# Patient Record
Sex: Female | Born: 1950 | Race: White | Hispanic: No | Smoking: Current every day smoker
Health system: Southern US, Community
[De-identification: ages and names within clinical notes are randomized; demographics above are authoritative.]

## PROBLEM LIST (undated history)

## (undated) DIAGNOSIS — Z720 Tobacco use: Secondary | ICD-10-CM

## (undated) DIAGNOSIS — J45909 Unspecified asthma, uncomplicated: Secondary | ICD-10-CM

## (undated) DIAGNOSIS — E039 Hypothyroidism, unspecified: Secondary | ICD-10-CM

## (undated) HISTORY — DX: Hypothyroidism, unspecified: E03.9

## (undated) HISTORY — PX: CHOLECYSTECTOMY: SHX55

## (undated) HISTORY — PX: ABDOMINAL HYSTERECTOMY: SUR658

## (undated) HISTORY — PX: APPENDECTOMY: SHX54

## (undated) HISTORY — PX: CERVICAL SPINE SURGERY: SHX589

## (undated) HISTORY — PX: BACK SURGERY: SHX140

## (undated) HISTORY — DX: Unspecified asthma, uncomplicated: J45.909

## (undated) HISTORY — PX: CARPAL TUNNEL RELEASE: SHX101

## (undated) HISTORY — DX: Tobacco use: Z72.0

---

## 2000-05-01 ENCOUNTER — Encounter: Payer: Self-pay | Admitting: Emergency Medicine

## 2000-05-01 ENCOUNTER — Emergency Department (HOSPITAL_COMMUNITY): Admission: EM | Admit: 2000-05-01 | Discharge: 2000-05-01 | Payer: Self-pay | Admitting: Emergency Medicine

## 2000-07-06 ENCOUNTER — Encounter: Payer: Self-pay | Admitting: Family Medicine

## 2000-07-06 ENCOUNTER — Encounter: Admission: RE | Admit: 2000-07-06 | Discharge: 2000-07-06 | Payer: Self-pay | Admitting: Family Medicine

## 2000-08-21 ENCOUNTER — Ambulatory Visit (HOSPITAL_COMMUNITY): Admission: RE | Admit: 2000-08-21 | Discharge: 2000-08-21 | Payer: Self-pay | Admitting: Family Medicine

## 2000-08-21 ENCOUNTER — Encounter: Payer: Self-pay | Admitting: Family Medicine

## 2001-06-01 ENCOUNTER — Encounter: Payer: Self-pay | Admitting: Family Medicine

## 2001-06-01 ENCOUNTER — Encounter: Admission: RE | Admit: 2001-06-01 | Discharge: 2001-06-01 | Payer: Self-pay | Admitting: Family Medicine

## 2001-06-13 ENCOUNTER — Encounter: Payer: Self-pay | Admitting: Family Medicine

## 2001-06-13 ENCOUNTER — Encounter: Admission: RE | Admit: 2001-06-13 | Discharge: 2001-06-13 | Payer: Self-pay | Admitting: Family Medicine

## 2002-05-07 ENCOUNTER — Emergency Department (HOSPITAL_COMMUNITY): Admission: EM | Admit: 2002-05-07 | Discharge: 2002-05-07 | Payer: Self-pay | Admitting: Emergency Medicine

## 2002-06-26 ENCOUNTER — Encounter: Payer: Self-pay | Admitting: Emergency Medicine

## 2002-06-26 ENCOUNTER — Emergency Department (HOSPITAL_COMMUNITY): Admission: EM | Admit: 2002-06-26 | Discharge: 2002-06-26 | Payer: Self-pay | Admitting: Emergency Medicine

## 2003-01-13 ENCOUNTER — Encounter: Admission: RE | Admit: 2003-01-13 | Discharge: 2003-01-13 | Payer: Self-pay

## 2003-02-07 ENCOUNTER — Ambulatory Visit (HOSPITAL_COMMUNITY): Admission: RE | Admit: 2003-02-07 | Discharge: 2003-02-07 | Payer: Self-pay | Admitting: Urology

## 2003-02-07 ENCOUNTER — Encounter: Payer: Self-pay | Admitting: Urology

## 2003-02-21 ENCOUNTER — Ambulatory Visit (HOSPITAL_BASED_OUTPATIENT_CLINIC_OR_DEPARTMENT_OTHER): Admission: RE | Admit: 2003-02-21 | Discharge: 2003-02-21 | Payer: Self-pay | Admitting: Urology

## 2003-03-07 ENCOUNTER — Ambulatory Visit (HOSPITAL_COMMUNITY): Admission: RE | Admit: 2003-03-07 | Discharge: 2003-03-07 | Payer: Self-pay | Admitting: Urology

## 2003-03-07 ENCOUNTER — Encounter: Admission: RE | Admit: 2003-03-07 | Discharge: 2003-03-07 | Payer: Self-pay | Admitting: Urology

## 2003-03-07 ENCOUNTER — Encounter: Payer: Self-pay | Admitting: Urology

## 2003-03-24 ENCOUNTER — Encounter: Payer: Self-pay | Admitting: Urology

## 2003-03-24 ENCOUNTER — Ambulatory Visit (HOSPITAL_COMMUNITY): Admission: RE | Admit: 2003-03-24 | Discharge: 2003-03-24 | Payer: Self-pay | Admitting: Urology

## 2003-10-07 ENCOUNTER — Ambulatory Visit (HOSPITAL_COMMUNITY): Admission: RE | Admit: 2003-10-07 | Discharge: 2003-10-07 | Payer: Self-pay | Admitting: Urology

## 2003-10-31 ENCOUNTER — Ambulatory Visit (HOSPITAL_BASED_OUTPATIENT_CLINIC_OR_DEPARTMENT_OTHER): Admission: RE | Admit: 2003-10-31 | Discharge: 2003-10-31 | Payer: Self-pay | Admitting: Urology

## 2004-04-06 ENCOUNTER — Ambulatory Visit (HOSPITAL_BASED_OUTPATIENT_CLINIC_OR_DEPARTMENT_OTHER): Admission: RE | Admit: 2004-04-06 | Discharge: 2004-04-06 | Payer: Self-pay | Admitting: Urology

## 2006-03-21 ENCOUNTER — Encounter: Admission: RE | Admit: 2006-03-21 | Discharge: 2006-03-21 | Payer: Self-pay | Admitting: Family Medicine

## 2006-04-24 ENCOUNTER — Ambulatory Visit (HOSPITAL_COMMUNITY): Admission: RE | Admit: 2006-04-24 | Discharge: 2006-04-25 | Payer: Self-pay | Admitting: Orthopaedic Surgery

## 2007-11-23 IMAGING — CR DG CHEST 2V
2 series · 2 of 2 positions shown · non-contrast
Comparison: None.

CLINICAL DATA: Pre-admit for L5-S1 disk protrusion.
 CHEST - 2 VIEW:

[view not recorded (1 of 2)]
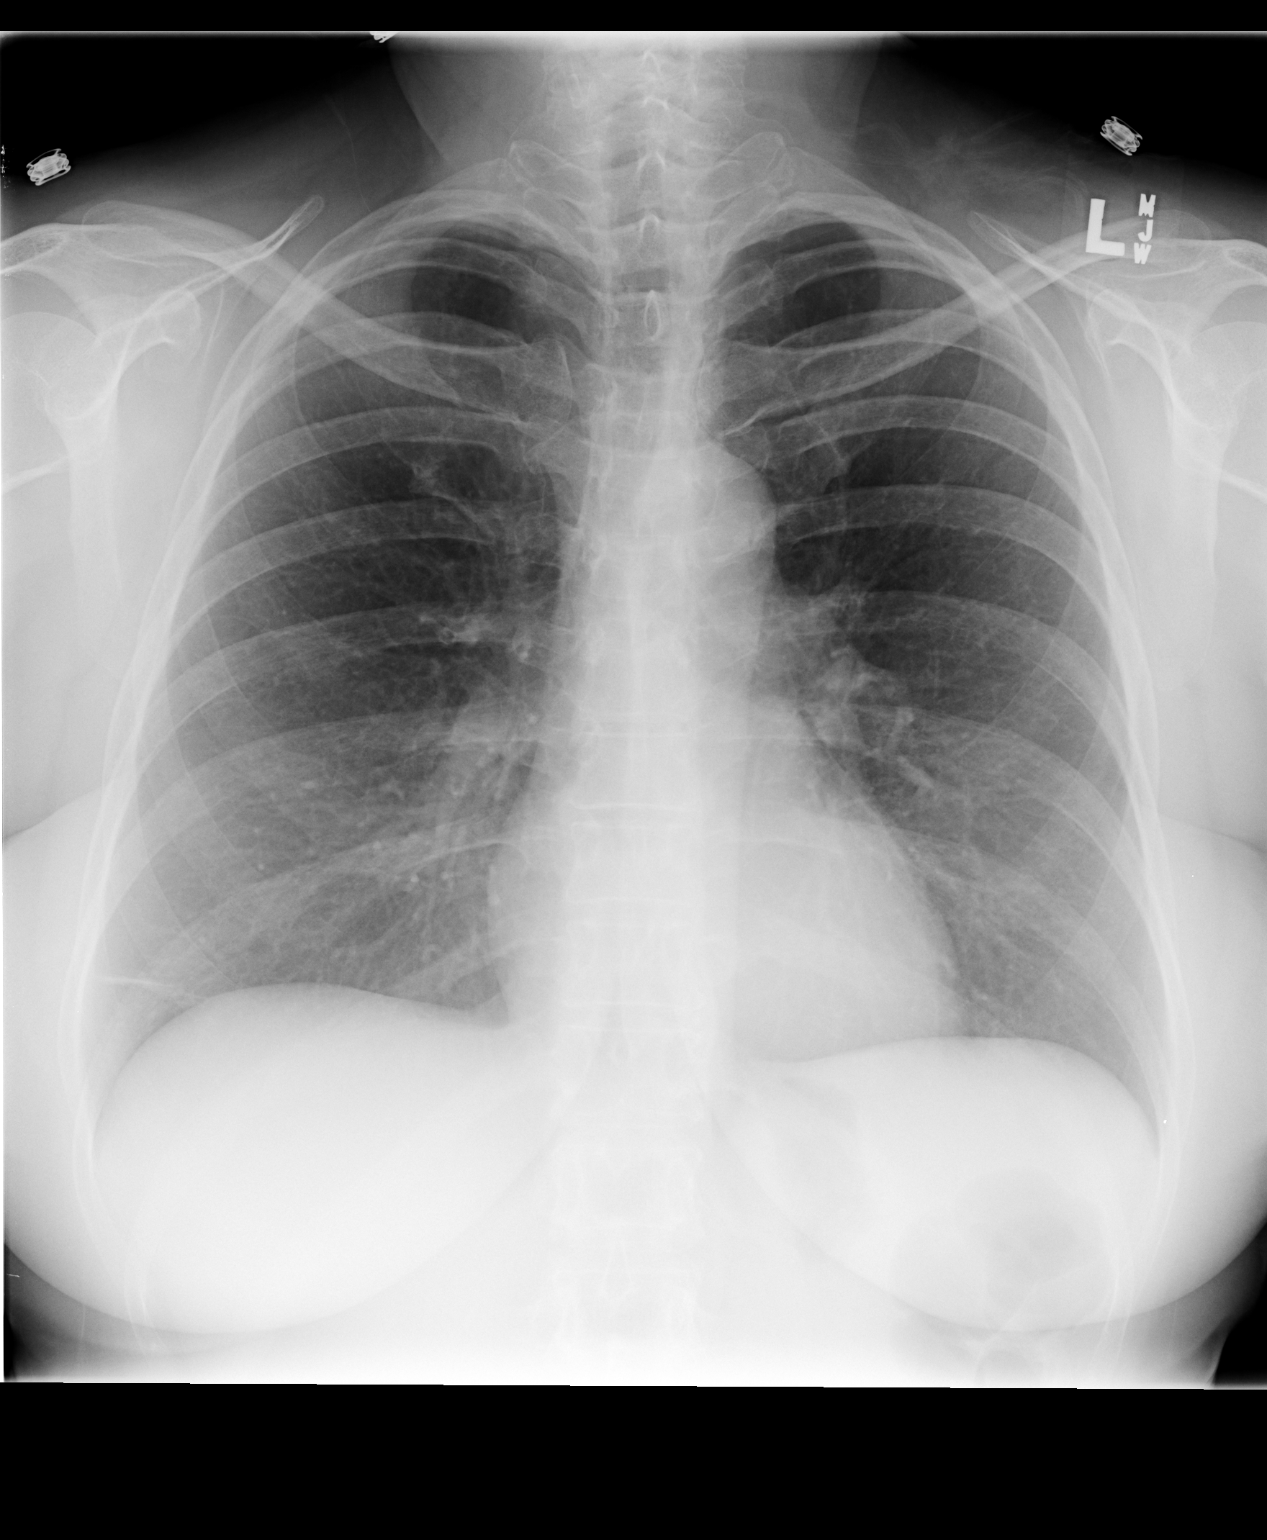

[view not recorded (2 of 2)]
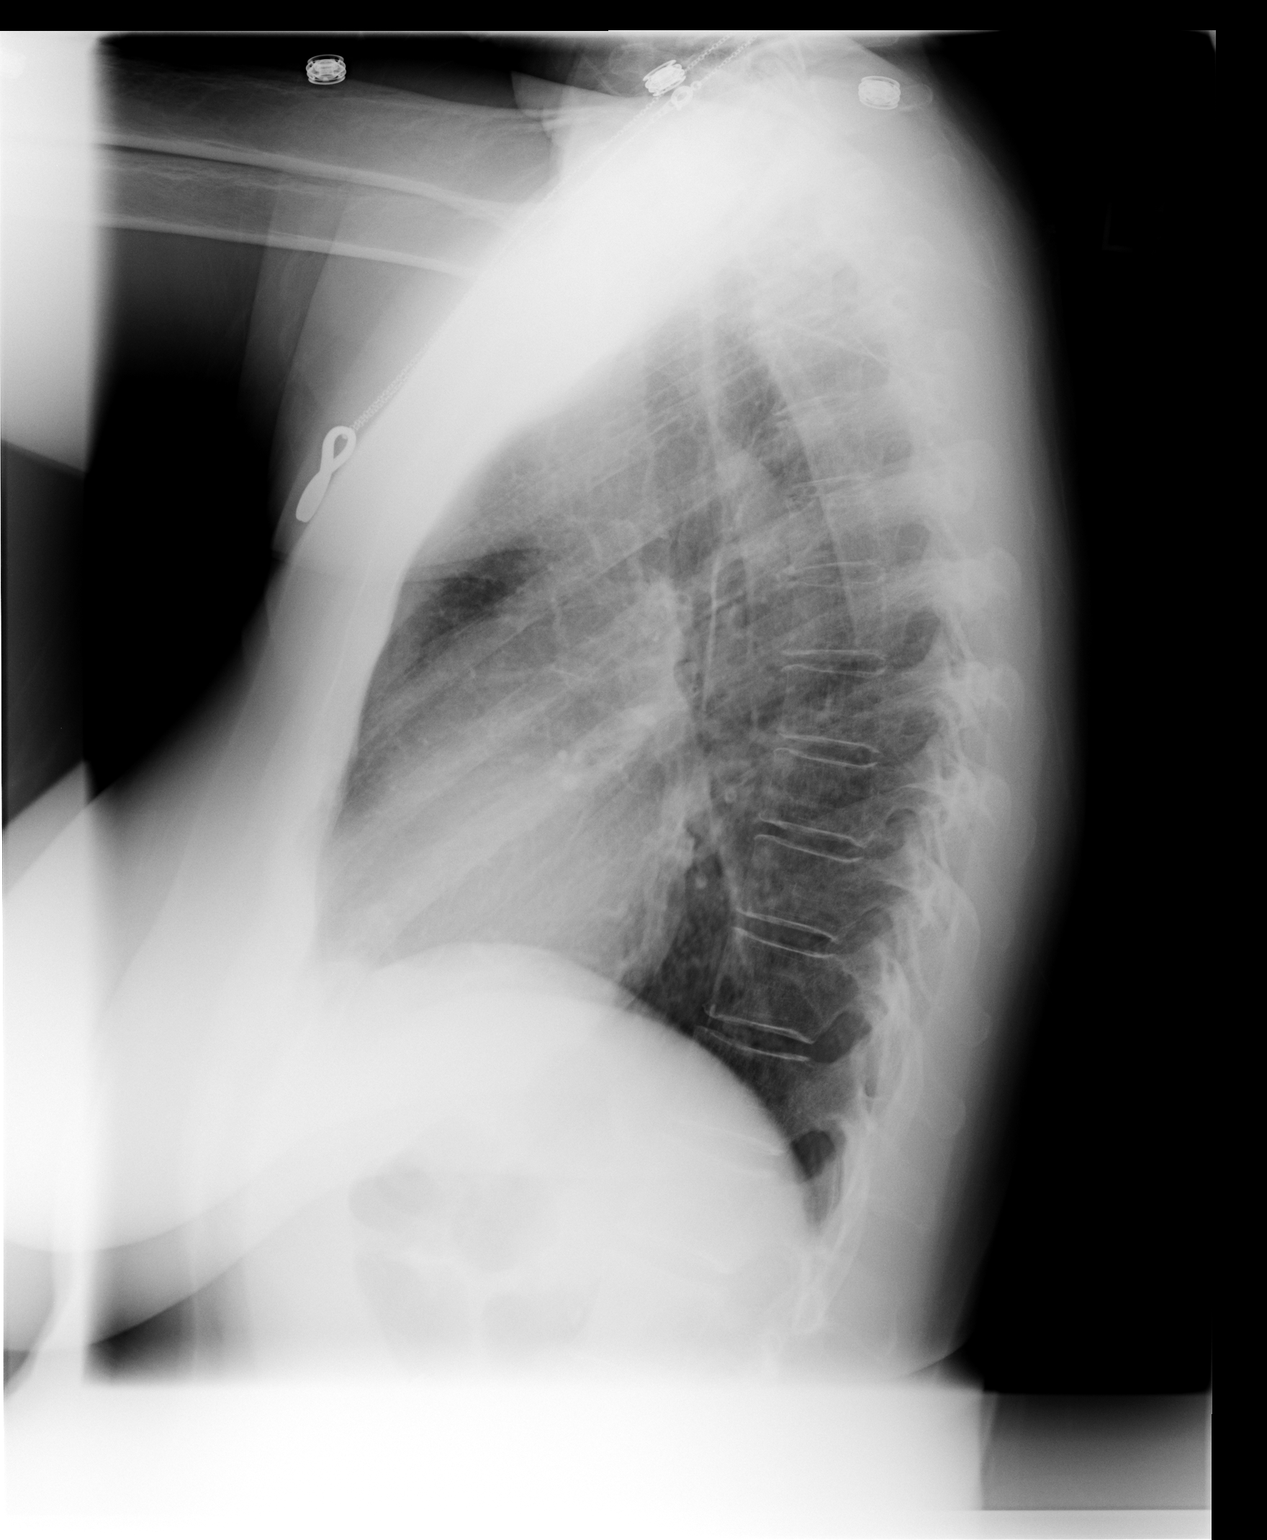

[2 of 2 positions shown; findings below may reference images not displayed]

FINDINGS: The heart and vascularity are normal.  There is mild peribronchial thickening and linear scarring at the right lung base.  No acute process.  Osseous structures are intact.
IMPRESSION: Chronic changes ? no active disease.

## 2010-08-22 ENCOUNTER — Encounter: Payer: Self-pay | Admitting: Urology

## 2013-02-28 ENCOUNTER — Ambulatory Visit (HOSPITAL_BASED_OUTPATIENT_CLINIC_OR_DEPARTMENT_OTHER): Payer: Self-pay

## 2013-03-14 ENCOUNTER — Encounter (HOSPITAL_BASED_OUTPATIENT_CLINIC_OR_DEPARTMENT_OTHER): Payer: Self-pay

## 2015-04-29 ENCOUNTER — Other Ambulatory Visit (HOSPITAL_COMMUNITY): Payer: Self-pay | Admitting: Sports Medicine

## 2015-04-29 DIAGNOSIS — M542 Cervicalgia: Secondary | ICD-10-CM

## 2015-05-11 ENCOUNTER — Ambulatory Visit (HOSPITAL_COMMUNITY)
Admission: RE | Admit: 2015-05-11 | Discharge: 2015-05-11 | Disposition: A | Discharge status: Home / Self Care | Payer: Medicare Other | Source: Ambulatory Visit | Attending: Sports Medicine | Admitting: Sports Medicine

## 2015-05-11 DIAGNOSIS — M4802 Spinal stenosis, cervical region: Secondary | ICD-10-CM | POA: Insufficient documentation

## 2015-05-11 DIAGNOSIS — M50322 Other cervical disc degeneration at C5-C6 level: Secondary | ICD-10-CM | POA: Insufficient documentation

## 2015-05-11 DIAGNOSIS — M542 Cervicalgia: Secondary | ICD-10-CM

## 2016-12-13 IMAGING — MR MR CERVICAL SPINE W/O CM
4 of 5 series · 19 of 48 positions shown · non-contrast
Comparison: None.

CLINICAL DATA: Neck pain with bilateral shoulder pain right greater
than left. Rule out HNP or spinal stenosis

EXAM:
MRI CERVICAL SPINE WITHOUT CONTRAST
TECHNIQUE: Multiplanar, multisequence MR imaging of the cervical spine was
performed. No intravenous contrast was administered.

[Series 5: T2 · sagittal · 3.0mm · 0.35mm/px · 6 of 13 slices shown (1 of 2)]
[im 1/13]
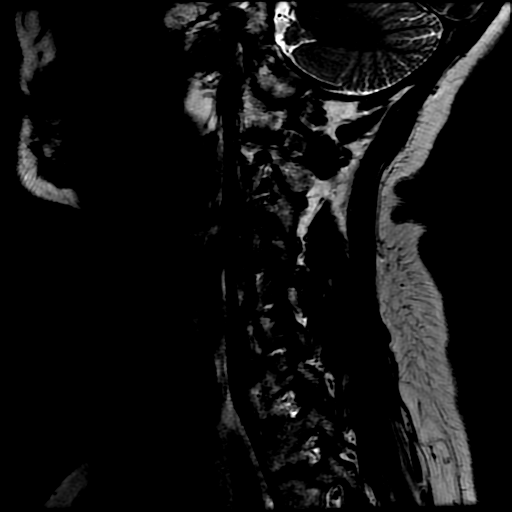
[im 3/13]
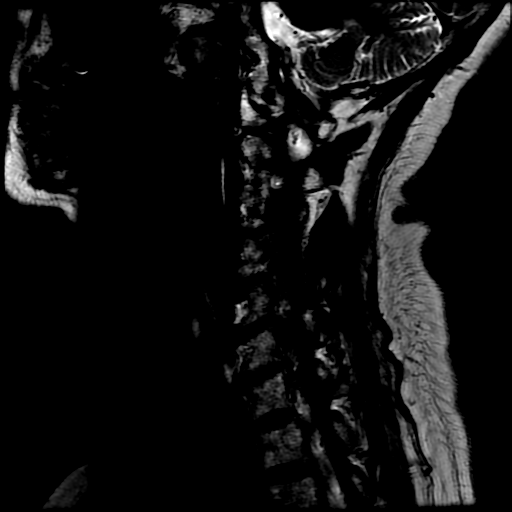
[im 5/13]
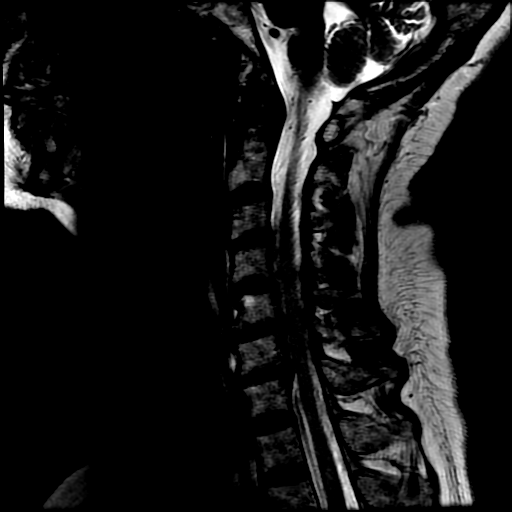
[im 8/13]
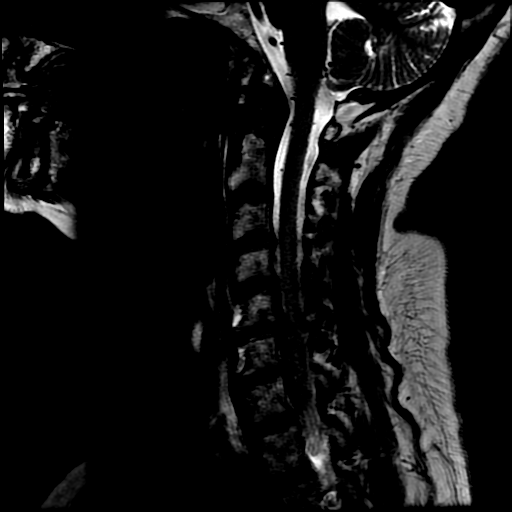
[im 10/13]
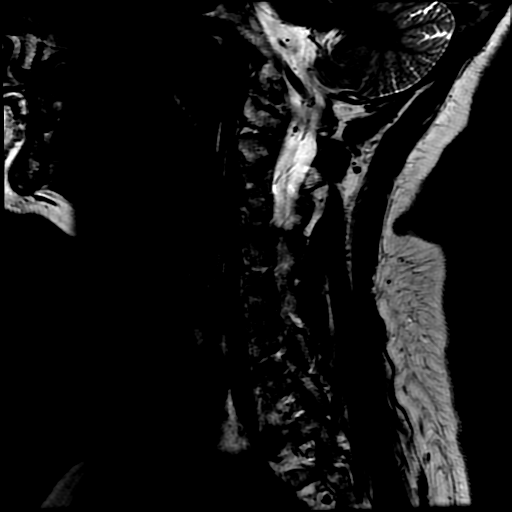
[im 13/13]
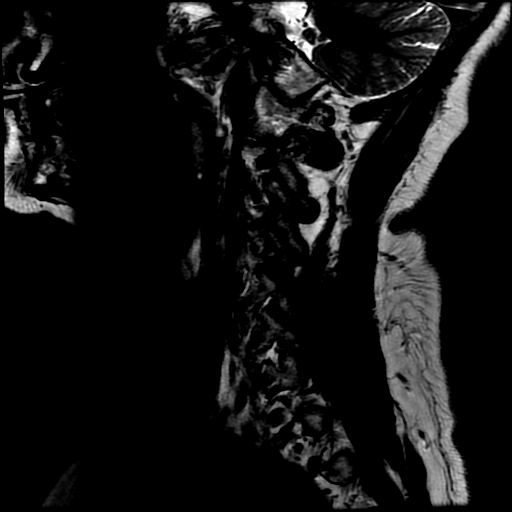

[Series 6: STIR · sagittal · 3.0mm · 0.35mm/px · 3 of 13 slices shown]
[im 3/13]
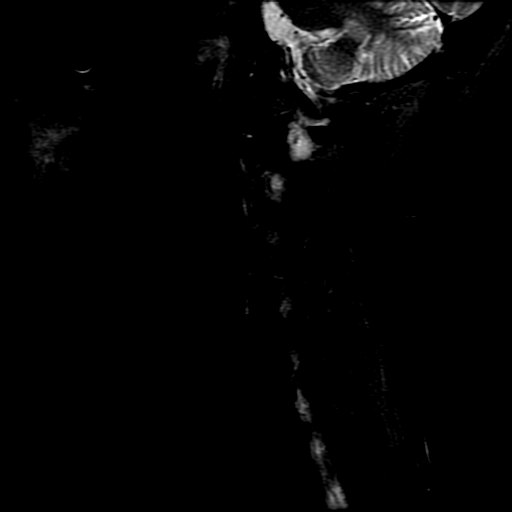
[im 8/13]
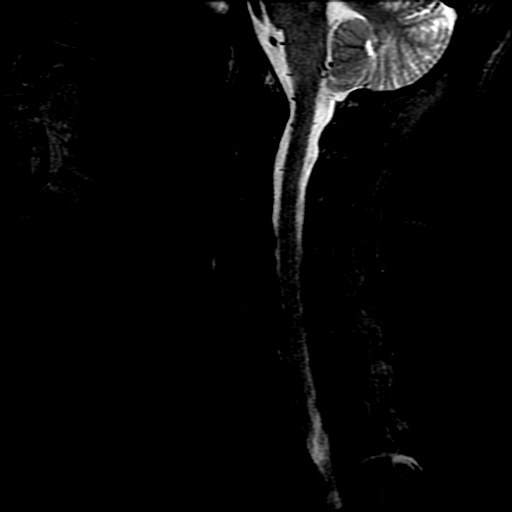
[im 13/13]
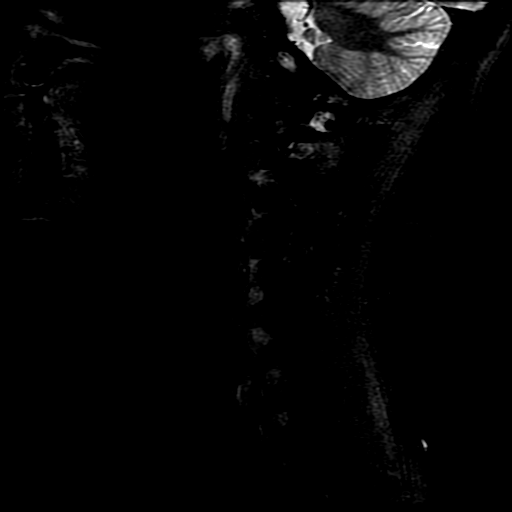

[Series 7: FLAIR · sagittal · 3.0mm · 0.35mm/px · 3 of 13 slices shown]
[im 3/13]
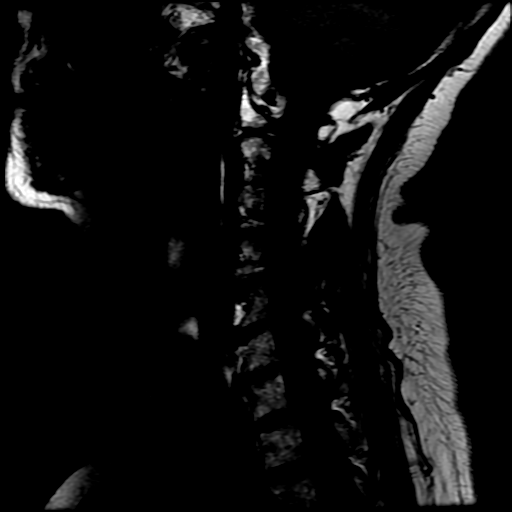
[im 8/13]
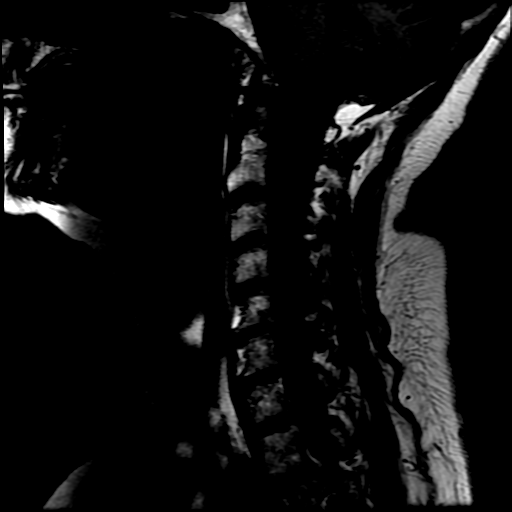
[im 13/13]
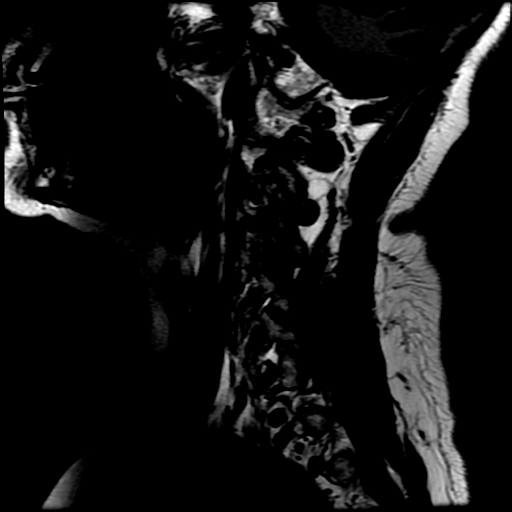

[Series 9: T2 · axial · 3.0mm · 0.35mm/px · z∈[-124,-43]mm · 7 of 31 slices shown (2 of 2)]
[im 1/31]
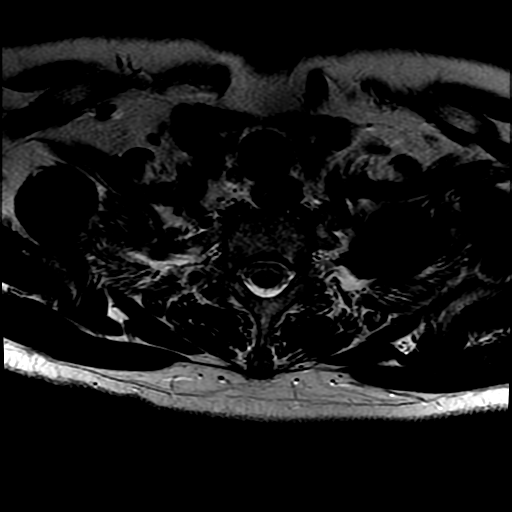
[im 5/31]
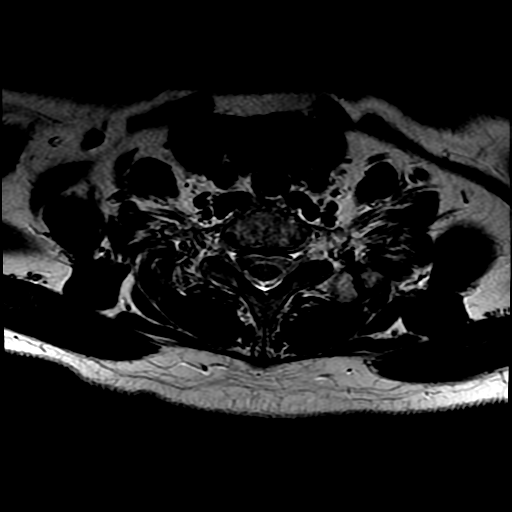
[im 10/31]
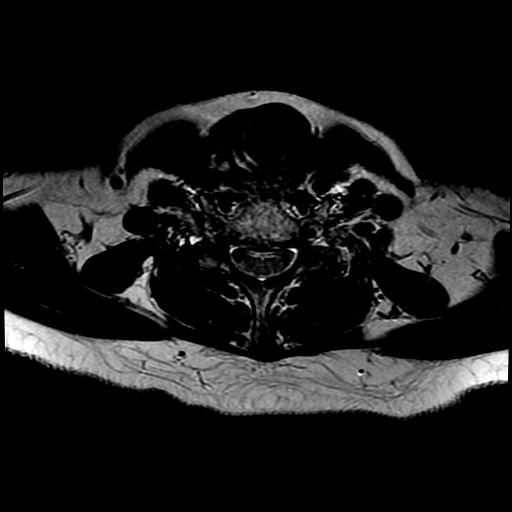
[im 14/31]
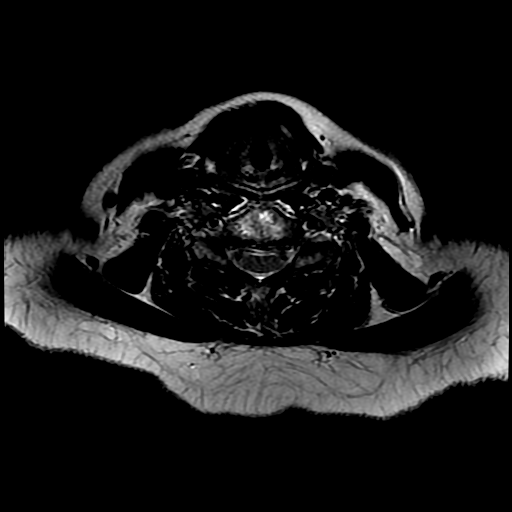
[im 17/31]
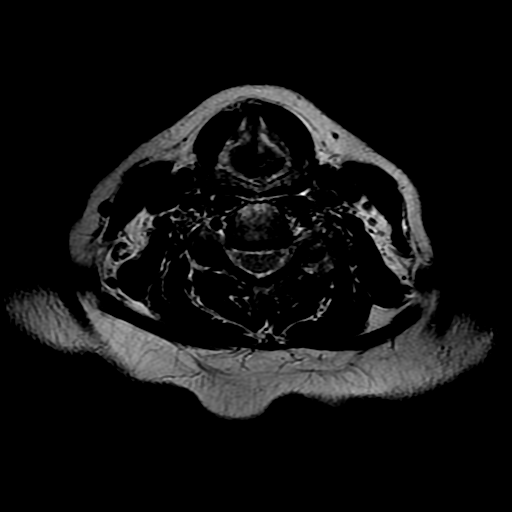
[im 21/31]
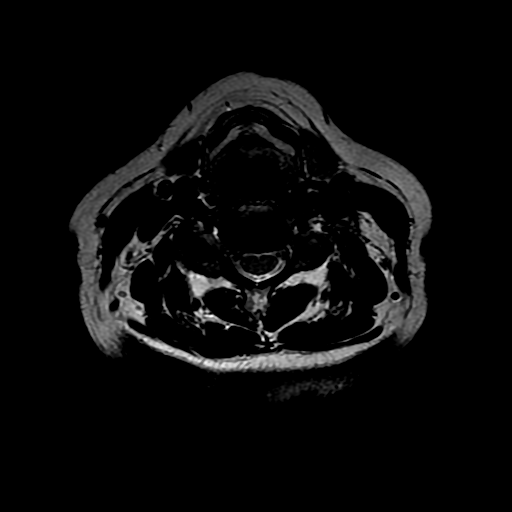
[im 26/31]
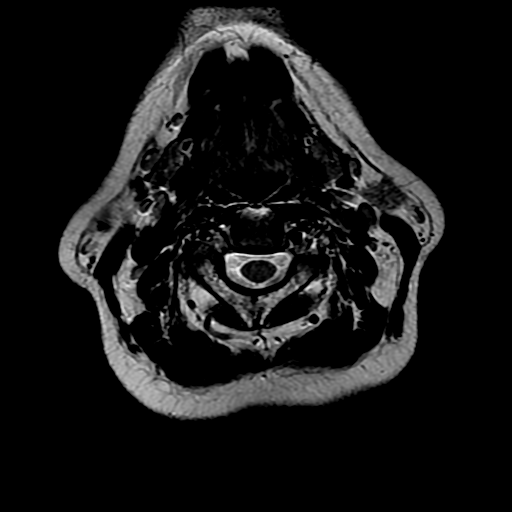

[19 of 48 positions shown; findings below may reference images not displayed]

FINDINGS: Normal cervical alignment. Negative for fracture or mass. No bone
marrow or soft tissue edema. Spinal cord signal normal. Cervical
medullary junction normal.

C2-3:  Negative

C3-4: Mild uncinate spurring right greater than left. Bilateral
facet hypertrophy. Mild spinal stenosis and mild foraminal stenosis
bilaterally.

C4-5: Disc degeneration with diffuse uncinate spurring which is
mild. Facet hypertrophy on the left causing left foraminal
encroachment. Mild spinal stenosis.

C5-6: Disc degeneration and spondylosis with moderate uncinate
spurring diffusely. Moderate spinal stenosis and moderate foraminal
stenosis bilaterally.

C6-7: Disc degeneration and spondylosis with diffuse uncinate
spurring. Moderate left foraminal narrowing due to spurring and mild
right foraminal narrowing. Mild spinal stenosis

C7-T1: Negative
IMPRESSION: Multilevel cervical degenerative change as described above. This is
causing spinal and foraminal stenosis due to uncinate spurring and
facet degeneration. Disc degeneration most prominent at C5-6 and
C6-7.

## 2017-10-31 ENCOUNTER — Ambulatory Visit (INDEPENDENT_AMBULATORY_CARE_PROVIDER_SITE_OTHER): Payer: Self-pay | Admitting: Physical Medicine and Rehabilitation

## 2017-11-14 ENCOUNTER — Ambulatory Visit (INDEPENDENT_AMBULATORY_CARE_PROVIDER_SITE_OTHER): Payer: Self-pay | Admitting: Physical Medicine and Rehabilitation

## 2018-08-06 DIAGNOSIS — Z01818 Encounter for other preprocedural examination: Secondary | ICD-10-CM

## 2019-11-06 ENCOUNTER — Other Ambulatory Visit: Payer: Self-pay

## 2019-11-06 ENCOUNTER — Ambulatory Visit (INDEPENDENT_AMBULATORY_CARE_PROVIDER_SITE_OTHER): Payer: Medicare Other | Admitting: Allergy and Immunology

## 2019-11-06 ENCOUNTER — Encounter: Payer: Self-pay | Admitting: Allergy and Immunology

## 2019-11-06 VITALS — BP 142/74 | HR 80 | Temp 97.2°F | Resp 18 | Ht 63.8 in | Wt 169.0 lb

## 2019-11-06 DIAGNOSIS — J449 Chronic obstructive pulmonary disease, unspecified: Secondary | ICD-10-CM

## 2019-11-06 DIAGNOSIS — J3089 Other allergic rhinitis: Secondary | ICD-10-CM

## 2019-11-06 DIAGNOSIS — F17200 Nicotine dependence, unspecified, uncomplicated: Secondary | ICD-10-CM

## 2019-11-06 MED ORDER — TRELEGY ELLIPTA 200-62.5-25 MCG/INH IN AEPB
1.0000 | INHALATION_SPRAY | Freq: Every day | RESPIRATORY_TRACT | 5 refills | Status: DC
Start: 2019-11-06 — End: 2019-12-04

## 2019-11-06 NOTE — Patient Instructions (Addendum)
  1.  Allergen avoidance measures - mold  2.  Treat and prevent inflammation:   A.  Trelegy 200 -1 inhalation 1 time per day  B.  OTC Nasacort -1 spray each nostril 1 time per day  3.  If needed:   A.  Cetirizine 10 mg -1 tablet 1-2 times a day  B.  Albuterol HFA -2 inhalations every 4-6 hours  4.  Consider using nicotine substitutes to replace tobacco smoke exposure  5.  Obtain Covid vaccine  6.  Consider a course of immunotherapy  7.  Return to clinic in 4 weeks or earlier if problem

## 2019-11-06 NOTE — Progress Notes (Signed)
[facility] - [facility]   NEW PATIENT NOTE  Referring Provider: No ref. provider found Primary Provider: Bonnita Nasuti, MD Date of office visit: 11/06/2019    Subjective:   Chief Complaint:  Isabel George (DOB: Dec 05, 1950) is a 69 y.o. female who presents to the clinic on 11/06/2019 with a chief complaint of Asthma and Allergic Rhinitis  .  HPI: Isabel George presents to this clinic in evaluation of allergies and asthma.  She has a multidecade history of having problems with allergies that show themselves during the spring through fall season with winter season actually doing relatively well manifested as wheezing and coughing and having to use a bronchodilator on a daily basis and some nasal congestion and nose blowing and postnasal drip and throat clearing and a mucous plug stuck in her throat.  As well, she sometimes gets fatigue and just feels kind of swollen all over her body from spring through fall season.  She is interested in undergoing a course of immunotherapy.  Other than the seasonality noted above she does not really note any obvious provoking factor giving rise to this issue except may be a very humid day.  She has tried multiple forms of therapy to address this issue unsuccessfully including the use of antihistamines and short acting bronchodilators.  She currently smokes a half a pack of cigarettes per day which she has been smoking for many decades for stress reduction.  She does consume chocolate on a daily basis but no other forms of caffeine.  She does not have any symptoms to suggest ongoing reflux disease.  She does have a history of gluten intolerance but not a food allergy.  Past Medical History:  Diagnosis Date  . Asthma   . Hypothyroidism   . Tobacco abuse     Past Surgical History:  Procedure Laterality Date  . ABDOMINAL HYSTERECTOMY    . APPENDECTOMY    . BACK SURGERY    . CARPAL TUNNEL RELEASE Right   . CERVICAL SPINE  SURGERY     C5-C7 fusion  . CHOLECYSTECTOMY      Allergies as of 11/06/2019      Reactions   Penicillins Swelling, Other (See Comments)   "drug overdose feeling"   Sulfa Antibiotics Other (See Comments)   Long lasting effect of unresponsiveness      Medication List      cetirizine 10 MG tablet Commonly known as: ZYRTEC Take 10 mg by mouth daily.   D3 VITAMIN PO Take by mouth daily.   Krill Oil 1000 MG Caps Take by mouth daily.   levothyroxine 50 MCG tablet Commonly known as: SYNTHROID Take 50 mcg by mouth daily.   ProAir HFA 108 (90 Base) MCG/ACT inhaler Generic drug: albuterol Inhale 2 puffs into the lungs every 6 (six) hours as needed for wheezing or shortness of breath.   PROBIOTIC DAILY PO Take by mouth daily.       Review of systems negative except as noted in HPI / PMHx or noted below:  Review of Systems  Constitutional: Negative.   HENT: Negative.   Eyes: Negative.   Respiratory: Negative.   Cardiovascular: Negative.   Gastrointestinal: Negative.   Genitourinary: Negative.   Musculoskeletal: Negative.   Skin: Negative.   Neurological: Negative.   Endo/Heme/Allergies: Negative.   Psychiatric/Behavioral: Negative.     Family History  Problem Relation Age of Onset  . Angina Mother   . Throat cancer Mother   .  Heart Problems Maternal Grandmother   . Asthma Grandson   . Heart Problems Son     Social History   Socioeconomic History  . Marital status: Widowed    Spouse name: Not on file  . Number of children: Not on file  . Years of education: Not on file  . Highest education level: Not on file  Occupational History  . Not on file  Tobacco Use  . Smoking status: Current Every Day Smoker    Packs/day: 0.50    Types: Cigarettes  . Smokeless tobacco: Never Used  Substance and Sexual Activity  . Alcohol use: Yes  . Drug use: Never  . Sexual activity: Not on file  Other Topics Concern  . Not on file  Social History Narrative  . Not on  file    Environmental and Social history  Lives in a mobile home with a dry environment, dog located inside the household, carpet in the bedroom, no plastic on the bed, no plastic on the pillow, actively smoking tobacco products.  Objective:   Vitals:   11/06/19 0952  BP: (!) 142/74  Pulse: 80  Resp: 18  Temp: (!) 97.2 F (36.2 C)  SpO2: 97%   Height: 5' 3.8" (162.1 cm) Weight: 169 lb (76.7 kg)  Physical Exam Constitutional:      Appearance: She is not diaphoretic.  HENT:     Head: Normocephalic.     Right Ear: Tympanic membrane, ear canal and external ear normal.     Left Ear: Tympanic membrane, ear canal and external ear normal.     Nose: Nose normal. No mucosal edema or rhinorrhea.     Mouth/Throat:     Pharynx: Uvula midline. No oropharyngeal exudate.  Eyes:     Conjunctiva/sclera: Conjunctivae normal.  Neck:     Thyroid: No thyromegaly.     Trachea: Trachea normal. No tracheal tenderness or tracheal deviation.  Cardiovascular:     Rate and Rhythm: Normal rate and regular rhythm.     Heart sounds: Normal heart sounds, S1 normal and S2 normal. No murmur.  Pulmonary:     Effort: No respiratory distress.     Breath sounds: Normal breath sounds. No stridor. No wheezing or rales.  Lymphadenopathy:     Head:     Right side of head: No tonsillar adenopathy.     Left side of head: No tonsillar adenopathy.     Cervical: No cervical adenopathy.  Skin:    Findings: No erythema or rash.     Nails: There is no clubbing.  Neurological:     Mental Status: She is alert.     Diagnostics: Allergy skin tests were performed.  She demonstrated hypersensitivity to mold.  Spirometry was performed and demonstrated an FEV1 of 1.67 @ 72 % of predicted. FEV1/FVC = 0.62  The patient had an Asthma Control Test with the following results:  .    Results of a chest x-ray obtained 20 October 2017 identified the following:  The heart size and mediastinal contours are within normal  limits. Both lungs are clear. The visualized skeletal structures are Unremarkable.  Results of blood tests obtained 20 October 2017 identified WBC 7.3, absolute eosinophil 200, absolute lymphocyte 3100, hemoglobin 13.0, platelet 214.  Assessment and Plan:    1. COPD with asthma (HCC)   2. Perennial allergic rhinitis   3. Heavy tobacco smoker     1.  Allergen avoidance measures - mold  2.  Treat and prevent inflammation:  A.  Trelegy 200 -1 inhalation 1 time per day  B.  OTC Nasacort -1 spray each nostril 1 time per day  3.  If needed:   A.  Cetirizine 10 mg -1 tablet 1-2 times a day  B.  Albuterol HFA -2 inhalations every 4-6 hours  4.  Consider using nicotine substitutes to replace tobacco smoke exposure  5.  Obtain Covid vaccine  6.  Consider a course of immunotherapy  7.  Return to clinic in 4 weeks or earlier if problem  Cella appears to have an irritated respiratory tract most likely secondary to a combination of her atopic disease and tobacco smoke exposure for which we will have her utilize the plan of action noted above which does include hopefully elimination of tobacco smoke exposure by using a nicotine substitute to replace her cigarettes.  She would definitely be a candidate for immunotherapy and she is presently considering that option.  I will see her back in this clinic in 4 weeks or earlier if there is a problem.  Laurette Schimke, MD Allergy / Immunology [facility]

## 2019-11-07 ENCOUNTER — Encounter: Payer: Self-pay | Admitting: Allergy and Immunology

## 2019-11-20 ENCOUNTER — Other Ambulatory Visit: Payer: Self-pay | Admitting: Allergy and Immunology

## 2019-11-20 DIAGNOSIS — J3089 Other allergic rhinitis: Secondary | ICD-10-CM

## 2019-11-20 DIAGNOSIS — J302 Other seasonal allergic rhinitis: Secondary | ICD-10-CM | POA: Diagnosis not present

## 2019-11-20 NOTE — Progress Notes (Signed)
VIALS EXP 11-19-20 

## 2019-12-04 ENCOUNTER — Encounter: Payer: Self-pay | Admitting: Allergy and Immunology

## 2019-12-04 ENCOUNTER — Ambulatory Visit (INDEPENDENT_AMBULATORY_CARE_PROVIDER_SITE_OTHER): Payer: Medicare Other | Admitting: Allergy and Immunology

## 2019-12-04 ENCOUNTER — Other Ambulatory Visit: Payer: Self-pay

## 2019-12-04 VITALS — BP 152/82 | HR 84 | Temp 97.9°F | Resp 14

## 2019-12-04 DIAGNOSIS — J449 Chronic obstructive pulmonary disease, unspecified: Secondary | ICD-10-CM

## 2019-12-04 DIAGNOSIS — J3089 Other allergic rhinitis: Secondary | ICD-10-CM | POA: Diagnosis not present

## 2019-12-04 DIAGNOSIS — F17200 Nicotine dependence, unspecified, uncomplicated: Secondary | ICD-10-CM

## 2019-12-04 MED ORDER — METHYLPREDNISOLONE ACETATE 80 MG/ML IJ SUSP
80.0000 mg | Freq: Once | INTRAMUSCULAR | Status: AC
  Administered 2019-12-04: 80 mg via INTRAMUSCULAR

## 2019-12-04 MED ORDER — TRELEGY ELLIPTA 200-62.5-25 MCG/INH IN AEPB: 1.0000 | INHALATION_SPRAY | Freq: Every day | RESPIRATORY_TRACT | 1 refills | Status: DC

## 2019-12-04 MED ORDER — OLOPATADINE HCL 0.2 % OP SOLN: OPHTHALMIC | 5 refills | Status: DC

## 2019-12-04 NOTE — Progress Notes (Signed)
[facility] [facility] [place] [facility] [place]   Follow-up Note  Referring Provider: Bonnita Nasuti, MD Primary Provider: Bonnita Nasuti, MD Date of Office Visit: 12/04/2019  Subjective:   Isabel George (DOB: November 22, 1950) is a 69 y.o. female who returns to the [facility] [place] on 12/04/2019 in re-evaluation of the following:  HPI: Evita returns to this clinic in evaluation of allergic rhinitis and asthma and tobacco smoking addressed during her initial evaluation of 06 November 2019.  She really did quite well until the the hot weather arrived over the course of the past week or so.  Prior to that point in time she really did feel as though Trelegy resulted in good improvement regarding her coughing and other respiratory tract symptoms.  Currently she needs to use her bronchodilator a few times per day and has coughing and wheezing has had a little bit of problem with her upper airways although no anosmia or ugly nasal discharge or symptoms suggesting that there is a ongoing infection.  She continues to smoke a half a pack of cigarettes per day.  She refuses to receive the Covid vaccination.  Allergies as of 12/04/2019      Reactions   Penicillins Swelling, Other (See Comments)   "drug overdose feeling"   Sulfa Antibiotics Other (See Comments)   Long lasting effect of unresponsiveness      Medication List      cetirizine 10 MG tablet Commonly known as: ZYRTEC Take 10 mg by mouth daily.   D3 VITAMIN PO Take by mouth daily.   Krill Oil 1000 MG Caps Take by mouth daily.   levothyroxine 50 MCG tablet Commonly known as: SYNTHROID Take 50 mcg by mouth daily.   Nasacort Allergy 24HR 55 MCG/ACT Aero nasal inhaler Generic drug: triamcinolone Place 2 sprays into the nose daily.   ProAir HFA 108 (90 Base) MCG/ACT inhaler Generic drug: albuterol Inhale 2 puffs into the lungs every 6 (six) hours as needed for wheezing or shortness of breath.   PROBIOTIC DAILY  PO Take by mouth daily.   Trelegy Ellipta 200-62.5-25 MCG/INH Aepb Generic drug: Fluticasone-Umeclidin-Vilant Inhale 1 Dose into the lungs daily. Rinse, gargle, and spit after use.       Past Medical History:  Diagnosis Date  . Asthma   . Hypothyroidism   . Tobacco abuse     Past Surgical History:  Procedure Laterality Date  . ABDOMINAL HYSTERECTOMY    . APPENDECTOMY    . BACK SURGERY    . CARPAL TUNNEL RELEASE Right   . CERVICAL SPINE SURGERY     C5-C7 fusion  . CHOLECYSTECTOMY      Review of systems negative except as noted in HPI / PMHx or noted below:  Review of Systems  Constitutional: Negative.   HENT: Negative.   Eyes: Negative.   Respiratory: Negative.   Cardiovascular: Negative.   Gastrointestinal: Negative.   Genitourinary: Negative.   Musculoskeletal: Negative.   Skin: Negative.   Neurological: Negative.   Endo/Heme/Allergies: Negative.   Psychiatric/Behavioral: Negative.      Objective:   Vitals:   12/04/19 1014  BP: (!) 152/82  Pulse: 84  Resp: 14  Temp: 97.9 F (36.6 C)  SpO2: 96%          Physical Exam Constitutional:      Appearance: She is not diaphoretic.  HENT:     Head: Normocephalic.     Right Ear: Tympanic membrane, ear canal and external  ear normal.     Left Ear: Tympanic membrane, ear canal and external ear normal.     Nose: Nose normal. No mucosal edema or rhinorrhea.     Mouth/Throat:     Pharynx: Uvula midline. No oropharyngeal exudate.  Eyes:     Conjunctiva/sclera: Conjunctivae normal.  Neck:     Thyroid: No thyromegaly.     Trachea: Trachea normal. No tracheal tenderness or tracheal deviation.  Cardiovascular:     Rate and Rhythm: Normal rate and regular rhythm.     Heart sounds: Normal heart sounds, S1 normal and S2 normal. No murmur.  Pulmonary:     Effort: No respiratory distress.     Breath sounds: Normal breath sounds. No stridor. No wheezing (Bilateral expiratory wheezes all lung fields) or rales.   Lymphadenopathy:     Head:     Right side of head: No tonsillar adenopathy.     Left side of head: No tonsillar adenopathy.     Cervical: No cervical adenopathy.  Skin:    Findings: No erythema or rash.     Nails: There is no clubbing.  Neurological:     Mental Status: She is alert.     Diagnostics:    Spirometry was performed and demonstrated an FEV1 of 1.23 at 53 % of predicted.  Assessment and Plan:   1. COPD with asthma (HCC)   2. Perennial allergic rhinitis   3. Heavy tobacco smoker     1.  Continue to perform allergen avoidance measures - mold  2.  Continue to treat and prevent inflammation:   A.  Trelegy 200 -1 inhalation 1 time per day  B.  OTC Nasacort -1 spray each nostril 1 time per day  C.  Depo-Medrol 80 IM delivered in clinic today  3.  If needed:   A.  Cetirizine 10 mg -1 tablet 1-2 times a day  B.  Albuterol HFA -2 inhalations every 4-6 hours  C.  Pataday -1 drop each eye 1 time per day  4.  Consider using nicotine substitutes to replace tobacco smoke exposure  5.  Start immunotherapy  6.  Return to clinic in late Summer or earlier if problem  Thomasene appears to have a slight flare of her respiratory tract disease and we will treat her with a systemic steroid as noted above while she continues on a collection of anti-inflammatory agents for airway and I have encouraged her to start her immunotherapy.  As well she obviously needs to stop smoking.  I will see her back in this clinic in the late summer 2021 or earlier if there is a problem.  Laurette Schimke, MD Allergy / Immunology [facility]

## 2019-12-04 NOTE — Patient Instructions (Addendum)
  1.  Continue to perform allergen avoidance measures - mold  2.  Continue to treat and prevent inflammation:   A.  Trelegy 200 -1 inhalation 1 time per day  B.  OTC Nasacort -1 spray each nostril 1 time per day  C.  Depo-Medrol 80 IM delivered in clinic today  3.  If needed:   A.  Cetirizine 10 mg -1 tablet 1-2 times a day  B.  Albuterol HFA -2 inhalations every 4-6 hours  C.  Pataday -1 drop each eye 1 time per day  4.  Consider using nicotine substitutes to replace tobacco smoke exposure  5.  Start immunotherapy  6.  Return to clinic in late Summer or earlier if problem

## 2019-12-05 ENCOUNTER — Encounter: Payer: Self-pay | Admitting: Allergy and Immunology

## 2019-12-09 ENCOUNTER — Ambulatory Visit (INDEPENDENT_AMBULATORY_CARE_PROVIDER_SITE_OTHER): Payer: Medicare Other | Admitting: *Deleted

## 2019-12-09 ENCOUNTER — Other Ambulatory Visit: Payer: Self-pay

## 2019-12-09 DIAGNOSIS — J309 Allergic rhinitis, unspecified: Secondary | ICD-10-CM | POA: Diagnosis not present

## 2019-12-09 MED ORDER — EPINEPHRINE 0.3 MG/0.3ML IJ SOAJ: INTRAMUSCULAR | 3 refills | Status: DC

## 2019-12-16 ENCOUNTER — Ambulatory Visit (INDEPENDENT_AMBULATORY_CARE_PROVIDER_SITE_OTHER): Payer: Medicare Other | Admitting: *Deleted

## 2019-12-16 DIAGNOSIS — J309 Allergic rhinitis, unspecified: Secondary | ICD-10-CM

## 2019-12-23 ENCOUNTER — Ambulatory Visit (INDEPENDENT_AMBULATORY_CARE_PROVIDER_SITE_OTHER): Payer: Medicare Other | Admitting: *Deleted

## 2019-12-23 DIAGNOSIS — J309 Allergic rhinitis, unspecified: Secondary | ICD-10-CM

## 2020-01-01 ENCOUNTER — Ambulatory Visit (INDEPENDENT_AMBULATORY_CARE_PROVIDER_SITE_OTHER): Payer: Medicare Other | Admitting: *Deleted

## 2020-01-01 DIAGNOSIS — J309 Allergic rhinitis, unspecified: Secondary | ICD-10-CM | POA: Diagnosis not present

## 2020-01-07 ENCOUNTER — Ambulatory Visit (INDEPENDENT_AMBULATORY_CARE_PROVIDER_SITE_OTHER): Payer: Medicare Other | Admitting: *Deleted

## 2020-01-07 DIAGNOSIS — J309 Allergic rhinitis, unspecified: Secondary | ICD-10-CM | POA: Diagnosis not present

## 2020-01-13 ENCOUNTER — Ambulatory Visit (INDEPENDENT_AMBULATORY_CARE_PROVIDER_SITE_OTHER): Payer: Medicare Other

## 2020-01-13 DIAGNOSIS — J309 Allergic rhinitis, unspecified: Secondary | ICD-10-CM | POA: Diagnosis not present

## 2020-01-20 ENCOUNTER — Ambulatory Visit (INDEPENDENT_AMBULATORY_CARE_PROVIDER_SITE_OTHER): Payer: Medicare Other | Admitting: *Deleted

## 2020-01-20 DIAGNOSIS — J309 Allergic rhinitis, unspecified: Secondary | ICD-10-CM

## 2020-01-27 ENCOUNTER — Ambulatory Visit (INDEPENDENT_AMBULATORY_CARE_PROVIDER_SITE_OTHER): Payer: Medicare Other

## 2020-01-27 DIAGNOSIS — J309 Allergic rhinitis, unspecified: Secondary | ICD-10-CM | POA: Diagnosis not present

## 2020-01-29 ENCOUNTER — Telehealth: Payer: Self-pay | Admitting: *Deleted

## 2020-01-29 NOTE — Telephone Encounter (Signed)
Isabel George calls stating that she is concerned that the Trelegy is causing her to have issues with her throat/breathing. She said the first little while of taking it wasn't too bad but now she has random spells of feeling like she's choking or that her airway is somewhat tight. She does say that these sensations have never occurred prior to using the Trelegy. She did stop using the Trelegy about 4 days ago and these episodes have not occurred since she stopped it. Please advise.

## 2020-01-29 NOTE — Telephone Encounter (Signed)
Informed Isabel George that we will try her on Breztri. She will pick up a sample so she can try it out prior to obtaining a prescription.

## 2020-01-29 NOTE — Telephone Encounter (Signed)
Please inform patient that she will probably need some type of controller agent to keep her from developing significant lung problems.  Apparently she cannot use Trelegy anymore.  We can try her on Breztri - 2 inhalations 2 times per day

## 2020-02-05 ENCOUNTER — Ambulatory Visit (INDEPENDENT_AMBULATORY_CARE_PROVIDER_SITE_OTHER): Payer: Medicare Other | Admitting: *Deleted

## 2020-02-05 DIAGNOSIS — J309 Allergic rhinitis, unspecified: Secondary | ICD-10-CM | POA: Diagnosis not present

## 2020-02-10 ENCOUNTER — Other Ambulatory Visit: Payer: Self-pay | Admitting: *Deleted

## 2020-02-10 ENCOUNTER — Telehealth: Payer: Self-pay | Admitting: Allergy and Immunology

## 2020-02-10 ENCOUNTER — Ambulatory Visit (INDEPENDENT_AMBULATORY_CARE_PROVIDER_SITE_OTHER): Payer: Medicare Other | Admitting: *Deleted

## 2020-02-10 DIAGNOSIS — J309 Allergic rhinitis, unspecified: Secondary | ICD-10-CM | POA: Diagnosis not present

## 2020-02-10 MED ORDER — BREZTRI AEROSPHERE 160-9-4.8 MCG/ACT IN AERO: INHALATION_SPRAY | RESPIRATORY_TRACT | 1 refills | Status: DC

## 2020-02-10 NOTE — Telephone Encounter (Signed)
Breztri to [employer] please.

## 2020-02-10 NOTE — Telephone Encounter (Signed)
Rx sent 

## 2020-02-19 ENCOUNTER — Ambulatory Visit (INDEPENDENT_AMBULATORY_CARE_PROVIDER_SITE_OTHER): Payer: Medicare Other

## 2020-02-19 DIAGNOSIS — J309 Allergic rhinitis, unspecified: Secondary | ICD-10-CM | POA: Diagnosis not present

## 2020-02-26 ENCOUNTER — Ambulatory Visit (INDEPENDENT_AMBULATORY_CARE_PROVIDER_SITE_OTHER): Payer: Medicare Other

## 2020-02-26 DIAGNOSIS — J309 Allergic rhinitis, unspecified: Secondary | ICD-10-CM

## 2020-03-03 ENCOUNTER — Ambulatory Visit (INDEPENDENT_AMBULATORY_CARE_PROVIDER_SITE_OTHER): Payer: Medicare Other | Admitting: *Deleted

## 2020-03-03 DIAGNOSIS — J309 Allergic rhinitis, unspecified: Secondary | ICD-10-CM | POA: Diagnosis not present

## 2020-03-09 ENCOUNTER — Ambulatory Visit (INDEPENDENT_AMBULATORY_CARE_PROVIDER_SITE_OTHER): Payer: Medicare Other | Admitting: *Deleted

## 2020-03-09 DIAGNOSIS — J309 Allergic rhinitis, unspecified: Secondary | ICD-10-CM | POA: Diagnosis not present

## 2020-03-16 ENCOUNTER — Ambulatory Visit (INDEPENDENT_AMBULATORY_CARE_PROVIDER_SITE_OTHER): Payer: Medicare Other | Admitting: *Deleted

## 2020-03-16 DIAGNOSIS — J309 Allergic rhinitis, unspecified: Secondary | ICD-10-CM

## 2020-03-24 ENCOUNTER — Ambulatory Visit (INDEPENDENT_AMBULATORY_CARE_PROVIDER_SITE_OTHER): Payer: Medicare Other | Admitting: *Deleted

## 2020-03-24 DIAGNOSIS — J309 Allergic rhinitis, unspecified: Secondary | ICD-10-CM | POA: Diagnosis not present

## 2020-03-30 ENCOUNTER — Ambulatory Visit (INDEPENDENT_AMBULATORY_CARE_PROVIDER_SITE_OTHER): Payer: Medicare Other | Admitting: *Deleted

## 2020-03-30 DIAGNOSIS — J309 Allergic rhinitis, unspecified: Secondary | ICD-10-CM

## 2020-04-07 ENCOUNTER — Ambulatory Visit (INDEPENDENT_AMBULATORY_CARE_PROVIDER_SITE_OTHER): Payer: Medicare Other | Admitting: *Deleted

## 2020-04-07 DIAGNOSIS — J309 Allergic rhinitis, unspecified: Secondary | ICD-10-CM

## 2020-04-15 ENCOUNTER — Ambulatory Visit (INDEPENDENT_AMBULATORY_CARE_PROVIDER_SITE_OTHER): Payer: Medicare Other | Admitting: *Deleted

## 2020-04-15 DIAGNOSIS — J309 Allergic rhinitis, unspecified: Secondary | ICD-10-CM | POA: Diagnosis not present

## 2020-04-20 ENCOUNTER — Ambulatory Visit (INDEPENDENT_AMBULATORY_CARE_PROVIDER_SITE_OTHER): Payer: Medicare Other | Admitting: *Deleted

## 2020-04-20 DIAGNOSIS — J309 Allergic rhinitis, unspecified: Secondary | ICD-10-CM | POA: Diagnosis not present

## 2020-04-23 ENCOUNTER — Other Ambulatory Visit: Payer: Self-pay

## 2020-04-23 ENCOUNTER — Encounter: Payer: Self-pay | Admitting: Allergy and Immunology

## 2020-04-23 ENCOUNTER — Ambulatory Visit (INDEPENDENT_AMBULATORY_CARE_PROVIDER_SITE_OTHER): Payer: Medicare Other | Admitting: Allergy and Immunology

## 2020-04-23 VITALS — BP 120/70 | HR 82 | Resp 18

## 2020-04-23 DIAGNOSIS — G43909 Migraine, unspecified, not intractable, without status migrainosus: Secondary | ICD-10-CM | POA: Diagnosis not present

## 2020-04-23 DIAGNOSIS — J3089 Other allergic rhinitis: Secondary | ICD-10-CM | POA: Diagnosis not present

## 2020-04-23 DIAGNOSIS — F17200 Nicotine dependence, unspecified, uncomplicated: Secondary | ICD-10-CM | POA: Diagnosis not present

## 2020-04-23 DIAGNOSIS — J449 Chronic obstructive pulmonary disease, unspecified: Secondary | ICD-10-CM | POA: Diagnosis not present

## 2020-04-23 NOTE — Progress Notes (Signed)
[place] [facility] [place]   Follow-up Note  Referring Provider: Galvin Proffer, MD Primary Provider: Galvin Proffer, MD Date of Office Visit: 04/23/2020  Subjective:   Isabel George (DOB: 10/21/1950) is a 69 y.o. female who [facility] 04[facility]2020 in re-evaluation of the following:  HPI: Isabel George returns to this clinic in evaluation of allergic rhinitis and asthma and tobacco smoking.  Her last visit to this clinic was 04 Dec 2019.  Overall she has done relatively well with her airway issue while she continues to use a combination inhaler on a consistent basis along with her immunotherapy and also continues to smoke tobacco products at a rate of 10 cigarettes/day.  She occasionally uses a short acting bronchodilator but she can go a week without utilizing this agent.  During hot humid days she can use this short acting bronchodilator up to a few times a day.  She has not required a systemic steroid or an antibiotic for any type of airway issue.  She will not be receiving the flu vaccine or the Covid vaccination.  Currently her immunotherapy is going every week without any adverse effect.  She does inform me that she has been having recurrent headaches which has been a longstanding issue.  There is a predilection for involving her right side especially around her right eye.  She uses multiple over-the-counter medications for this headache.  Allergies as of 04/23/2020      Reactions   Penicillins Swelling, Other (See Comments)   "drug overdose feeling"   Sulfa Antibiotics Other (See Comments)   Long lasting effect of unresponsiveness      Medication List    Breztri Aerosphere 160-9-4.8 MCG/ACT Aero Generic drug: Budeson-Glycopyrrol-Formoterol Inhale two puffs twice daily to prevent cough or wheeze. Rinse mouth after use.   D3 VITAMIN PO Take by mouth daily.   EPINEPHrine 0.3 mg/0.3 mL Soaj injection Commonly known as:  EPI-PEN Use as directed for life threatening allergic reactions only   levothyroxine 50 MCG tablet Commonly known as: SYNTHROID Take 50 mcg by mouth daily.   loratadine 10 MG tablet Commonly known as: CLARITIN Take 10 mg by mouth daily.   Nasacort Allergy 24HR 55 MCG/ACT Aero nasal inhaler Generic drug: triamcinolone Place 2 sprays into the nose daily.   Olopatadine HCl 0.2 % Soln Can use one drop in each eye once daily if needed.   ProAir HFA 108 (90 Base) MCG/ACT inhaler Generic drug: albuterol Inhale 2 puffs into the lungs every 6 (six) hours as needed for wheezing or shortness of breath.   PROBIOTIC DAILY PO Take by mouth daily.       Past Medical History:  Diagnosis Date  . Asthma   . Hypothyroidism   . Tobacco abuse     Past Surgical History:  Procedure Laterality Date  . ABDOMINAL HYSTERECTOMY    . APPENDECTOMY    . BACK SURGERY    . CARPAL TUNNEL RELEASE Right   . CERVICAL SPINE SURGERY     C5-C7 fusion  . CHOLECYSTECTOMY      Review of systems negative except as noted in HPI / PMHx or noted below:  Review of Systems  Constitutional: Negative.   HENT: Negative.   Eyes: Negative.   Respiratory: Negative.   Cardiovascular: Negative.   Gastrointestinal: Negative.   Genitourinary: Negative.   Musculoskeletal: Negative.   Skin: Negative.   Neurological: Negative.   Endo/Heme/Allergies: Negative.  Psychiatric/Behavioral: Negative.      Objective:   Vitals:   04/23/20 1105  BP: 120/70  Pulse: 82  Resp: 18  SpO2: 95%          Physical Exam Constitutional:      Appearance: She is not diaphoretic.  HENT:     Head: Normocephalic.     Right Ear: Tympanic membrane, ear canal and external ear normal.     Left Ear: Tympanic membrane, ear canal and external ear normal.     Nose: Nose normal. No mucosal edema or rhinorrhea.     Mouth/Throat:     Pharynx: Uvula midline. No oropharyngeal exudate.  Eyes:     Conjunctiva/sclera: Conjunctivae  normal.  Neck:     Thyroid: No thyromegaly.     Trachea: Trachea normal. No tracheal tenderness or tracheal deviation.  Cardiovascular:     Rate and Rhythm: Normal rate and regular rhythm.     Heart sounds: Normal heart sounds, S1 normal and S2 normal. No murmur heard.   Pulmonary:     Effort: No respiratory distress.     Breath sounds: Normal breath sounds. No stridor. No wheezing or rales.  Lymphadenopathy:     Head:     Right side of head: No tonsillar adenopathy.     Left side of head: No tonsillar adenopathy.     Cervical: No cervical adenopathy.  Skin:    Findings: No erythema or rash.     Nails: There is no clubbing.  Neurological:     Mental Status: She is alert.     Diagnostics: Spirometry was performed.  Her FEV1 was 1.08 which was 47% of predicted.  Assessment and Plan:   1. COPD with asthma (HCC)   2. Perennial allergic rhinitis   3. Heavy tobacco smoker   4. Migraine syndrome     1.  Continue to perform allergen avoidance measures - mold  2.  Continue to treat and prevent inflammation:   A.  Breztri - 2 inhalations 2 times per day with spacer  B.  OTC Nasacort -1 spray each nostril 1 time per day  C.  Immunotherapy  3.  If needed:   A.  Cetirizine 10 mg -1 tablet 1-2 times a day  B.  Albuterol HFA -2 inhalations every 4-6 hours  C.  Pataday -1 drop each eye 1 time per day  4.  Consider using nicotine substitutes to replace tobacco smoke exposure  5.  Visit with Primary doctor about preventative plan for migraines  6.  Return to clinic in 6 months or earlier if problem  Isabel George will continue to use immunotherapy and a combination of anti-inflammatory agents for her atopic respiratory tract disease and hopefully will also at some point discontinue her tobacco smoking hobby which will dramatically improve her respiratory status.  As well, I have asked her to discuss with her primary care doctor about her migraine headaches as she would probably benefit  from some type of preventative antimigraine medication.  Laurette Schimke, MD Allergy / Immunology [facility]

## 2020-04-23 NOTE — Patient Instructions (Signed)
  1.  Continue to perform allergen avoidance measures - mold  2.  Continue to treat and prevent inflammation:   A.  Breztri - 2 inhalations 2 times per day with spacer  B.  OTC Nasacort -1 spray each nostril 1 time per day  C.  Immunotherapy  3.  If needed:   A.  Cetirizine 10 mg -1 tablet 1-2 times a day  B.  Albuterol HFA -2 inhalations every 4-6 hours  C.  Pataday -1 drop each eye 1 time per day  4.  Consider using nicotine substitutes to replace tobacco smoke exposure  5.  Visit with Primary doctor about preventative plan for migraines  6.  Return to clinic in 6 months or earlier if problem

## 2020-04-27 ENCOUNTER — Encounter: Payer: Self-pay | Admitting: Allergy and Immunology

## 2020-05-07 ENCOUNTER — Ambulatory Visit (INDEPENDENT_AMBULATORY_CARE_PROVIDER_SITE_OTHER): Payer: Medicare Other

## 2020-05-07 DIAGNOSIS — J309 Allergic rhinitis, unspecified: Secondary | ICD-10-CM | POA: Diagnosis not present

## 2020-05-13 ENCOUNTER — Ambulatory Visit (INDEPENDENT_AMBULATORY_CARE_PROVIDER_SITE_OTHER): Payer: Medicare Other | Admitting: *Deleted

## 2020-05-13 DIAGNOSIS — J309 Allergic rhinitis, unspecified: Secondary | ICD-10-CM | POA: Diagnosis not present

## 2020-05-19 ENCOUNTER — Ambulatory Visit (INDEPENDENT_AMBULATORY_CARE_PROVIDER_SITE_OTHER): Payer: Medicare Other | Admitting: *Deleted

## 2020-05-19 DIAGNOSIS — J309 Allergic rhinitis, unspecified: Secondary | ICD-10-CM

## 2020-06-01 ENCOUNTER — Ambulatory Visit (INDEPENDENT_AMBULATORY_CARE_PROVIDER_SITE_OTHER): Payer: Medicare Other | Admitting: *Deleted

## 2020-06-01 DIAGNOSIS — J309 Allergic rhinitis, unspecified: Secondary | ICD-10-CM | POA: Diagnosis not present

## 2020-06-10 ENCOUNTER — Ambulatory Visit (INDEPENDENT_AMBULATORY_CARE_PROVIDER_SITE_OTHER): Payer: Medicare Other

## 2020-06-10 DIAGNOSIS — J309 Allergic rhinitis, unspecified: Secondary | ICD-10-CM

## 2020-06-17 ENCOUNTER — Ambulatory Visit (INDEPENDENT_AMBULATORY_CARE_PROVIDER_SITE_OTHER): Payer: Medicare Other

## 2020-06-17 DIAGNOSIS — J309 Allergic rhinitis, unspecified: Secondary | ICD-10-CM

## 2020-06-23 ENCOUNTER — Ambulatory Visit (INDEPENDENT_AMBULATORY_CARE_PROVIDER_SITE_OTHER): Payer: Medicare Other

## 2020-06-23 DIAGNOSIS — J309 Allergic rhinitis, unspecified: Secondary | ICD-10-CM | POA: Diagnosis not present

## 2020-06-29 DIAGNOSIS — J302 Other seasonal allergic rhinitis: Secondary | ICD-10-CM

## 2020-06-29 NOTE — Progress Notes (Signed)
VIAL EXP 06-29-21 

## 2020-06-30 ENCOUNTER — Ambulatory Visit (INDEPENDENT_AMBULATORY_CARE_PROVIDER_SITE_OTHER): Payer: Medicare Other | Admitting: *Deleted

## 2020-06-30 DIAGNOSIS — J309 Allergic rhinitis, unspecified: Secondary | ICD-10-CM | POA: Diagnosis not present

## 2020-07-06 ENCOUNTER — Ambulatory Visit (INDEPENDENT_AMBULATORY_CARE_PROVIDER_SITE_OTHER): Payer: Medicare Other | Admitting: *Deleted

## 2020-07-06 DIAGNOSIS — J309 Allergic rhinitis, unspecified: Secondary | ICD-10-CM | POA: Diagnosis not present

## 2020-07-15 ENCOUNTER — Ambulatory Visit (INDEPENDENT_AMBULATORY_CARE_PROVIDER_SITE_OTHER): Payer: Medicare Other | Admitting: *Deleted

## 2020-07-15 DIAGNOSIS — J309 Allergic rhinitis, unspecified: Secondary | ICD-10-CM

## 2020-07-22 ENCOUNTER — Other Ambulatory Visit: Payer: Self-pay | Admitting: Allergy and Immunology

## 2020-07-29 ENCOUNTER — Ambulatory Visit (INDEPENDENT_AMBULATORY_CARE_PROVIDER_SITE_OTHER): Payer: Medicare Other | Admitting: *Deleted

## 2020-07-29 DIAGNOSIS — J309 Allergic rhinitis, unspecified: Secondary | ICD-10-CM

## 2020-08-06 ENCOUNTER — Ambulatory Visit (INDEPENDENT_AMBULATORY_CARE_PROVIDER_SITE_OTHER): Payer: Medicare Other

## 2020-08-06 DIAGNOSIS — J309 Allergic rhinitis, unspecified: Secondary | ICD-10-CM | POA: Diagnosis not present

## 2020-08-19 ENCOUNTER — Ambulatory Visit (INDEPENDENT_AMBULATORY_CARE_PROVIDER_SITE_OTHER): Payer: Medicare Other

## 2020-08-19 DIAGNOSIS — J309 Allergic rhinitis, unspecified: Secondary | ICD-10-CM | POA: Diagnosis not present

## 2020-08-25 ENCOUNTER — Ambulatory Visit (INDEPENDENT_AMBULATORY_CARE_PROVIDER_SITE_OTHER): Payer: Medicare Other

## 2020-08-25 DIAGNOSIS — J309 Allergic rhinitis, unspecified: Secondary | ICD-10-CM | POA: Diagnosis not present

## 2020-10-22 ENCOUNTER — Ambulatory Visit (INDEPENDENT_AMBULATORY_CARE_PROVIDER_SITE_OTHER): Payer: Medicare Other | Admitting: *Deleted

## 2020-10-22 DIAGNOSIS — J309 Allergic rhinitis, unspecified: Secondary | ICD-10-CM

## 2020-11-03 DIAGNOSIS — J302 Other seasonal allergic rhinitis: Secondary | ICD-10-CM | POA: Diagnosis not present

## 2020-11-04 ENCOUNTER — Ambulatory Visit (INDEPENDENT_AMBULATORY_CARE_PROVIDER_SITE_OTHER): Payer: Medicare Other

## 2020-11-04 DIAGNOSIS — J309 Allergic rhinitis, unspecified: Secondary | ICD-10-CM

## 2020-11-04 NOTE — Progress Notes (Signed)
VIAL EXP 11-04-21 

## 2020-11-10 ENCOUNTER — Ambulatory Visit (INDEPENDENT_AMBULATORY_CARE_PROVIDER_SITE_OTHER): Payer: Medicare Other

## 2020-11-10 DIAGNOSIS — J309 Allergic rhinitis, unspecified: Secondary | ICD-10-CM | POA: Diagnosis not present

## 2020-11-18 ENCOUNTER — Ambulatory Visit (INDEPENDENT_AMBULATORY_CARE_PROVIDER_SITE_OTHER): Payer: Medicare Other | Admitting: *Deleted

## 2020-11-18 DIAGNOSIS — J309 Allergic rhinitis, unspecified: Secondary | ICD-10-CM | POA: Diagnosis not present

## 2020-11-24 ENCOUNTER — Ambulatory Visit (INDEPENDENT_AMBULATORY_CARE_PROVIDER_SITE_OTHER): Payer: Medicare Other

## 2020-11-24 DIAGNOSIS — J309 Allergic rhinitis, unspecified: Secondary | ICD-10-CM | POA: Diagnosis not present

## 2020-11-30 ENCOUNTER — Ambulatory Visit (INDEPENDENT_AMBULATORY_CARE_PROVIDER_SITE_OTHER): Payer: Medicare Other | Admitting: *Deleted

## 2020-11-30 DIAGNOSIS — J309 Allergic rhinitis, unspecified: Secondary | ICD-10-CM | POA: Diagnosis not present

## 2020-12-09 ENCOUNTER — Ambulatory Visit (INDEPENDENT_AMBULATORY_CARE_PROVIDER_SITE_OTHER): Payer: Medicare Other | Admitting: *Deleted

## 2020-12-09 DIAGNOSIS — J309 Allergic rhinitis, unspecified: Secondary | ICD-10-CM

## 2020-12-15 ENCOUNTER — Other Ambulatory Visit: Payer: Self-pay

## 2020-12-15 ENCOUNTER — Ambulatory Visit (INDEPENDENT_AMBULATORY_CARE_PROVIDER_SITE_OTHER): Payer: Medicare Other | Admitting: Sports Medicine

## 2020-12-15 ENCOUNTER — Other Ambulatory Visit: Payer: Self-pay | Admitting: Sports Medicine

## 2020-12-15 ENCOUNTER — Encounter: Payer: Self-pay | Admitting: Sports Medicine

## 2020-12-15 DIAGNOSIS — I10 Essential (primary) hypertension: Secondary | ICD-10-CM | POA: Insufficient documentation

## 2020-12-15 DIAGNOSIS — I714 Abdominal aortic aneurysm, without rupture, unspecified: Secondary | ICD-10-CM | POA: Insufficient documentation

## 2020-12-15 DIAGNOSIS — M79672 Pain in left foot: Secondary | ICD-10-CM

## 2020-12-15 DIAGNOSIS — E559 Vitamin D deficiency, unspecified: Secondary | ICD-10-CM | POA: Insufficient documentation

## 2020-12-15 DIAGNOSIS — E039 Hypothyroidism, unspecified: Secondary | ICD-10-CM | POA: Insufficient documentation

## 2020-12-15 DIAGNOSIS — Z79899 Other long term (current) drug therapy: Secondary | ICD-10-CM | POA: Insufficient documentation

## 2020-12-15 DIAGNOSIS — J449 Chronic obstructive pulmonary disease, unspecified: Secondary | ICD-10-CM | POA: Insufficient documentation

## 2020-12-15 DIAGNOSIS — J302 Other seasonal allergic rhinitis: Secondary | ICD-10-CM | POA: Insufficient documentation

## 2020-12-15 DIAGNOSIS — R519 Headache, unspecified: Secondary | ICD-10-CM | POA: Insufficient documentation

## 2020-12-15 DIAGNOSIS — M5136 Other intervertebral disc degeneration, lumbar region: Secondary | ICD-10-CM | POA: Insufficient documentation

## 2020-12-15 DIAGNOSIS — D518 Other vitamin B12 deficiency anemias: Secondary | ICD-10-CM | POA: Insufficient documentation

## 2020-12-15 DIAGNOSIS — I700 Atherosclerosis of aorta: Secondary | ICD-10-CM | POA: Insufficient documentation

## 2020-12-15 DIAGNOSIS — F411 Generalized anxiety disorder: Secondary | ICD-10-CM | POA: Insufficient documentation

## 2020-12-15 DIAGNOSIS — M2042 Other hammer toe(s) (acquired), left foot: Secondary | ICD-10-CM

## 2020-12-15 DIAGNOSIS — I6523 Occlusion and stenosis of bilateral carotid arteries: Secondary | ICD-10-CM | POA: Insufficient documentation

## 2020-12-15 DIAGNOSIS — K900 Celiac disease: Secondary | ICD-10-CM | POA: Insufficient documentation

## 2020-12-15 DIAGNOSIS — H6120 Impacted cerumen, unspecified ear: Secondary | ICD-10-CM | POA: Insufficient documentation

## 2020-12-15 DIAGNOSIS — E119 Type 2 diabetes mellitus without complications: Secondary | ICD-10-CM | POA: Insufficient documentation

## 2020-12-15 DIAGNOSIS — M79671 Pain in right foot: Secondary | ICD-10-CM

## 2020-12-15 DIAGNOSIS — L84 Corns and callosities: Secondary | ICD-10-CM

## 2020-12-15 DIAGNOSIS — R601 Generalized edema: Secondary | ICD-10-CM | POA: Insufficient documentation

## 2020-12-15 DIAGNOSIS — E042 Nontoxic multinodular goiter: Secondary | ICD-10-CM | POA: Insufficient documentation

## 2020-12-15 DIAGNOSIS — R0989 Other specified symptoms and signs involving the circulatory and respiratory systems: Secondary | ICD-10-CM | POA: Insufficient documentation

## 2020-12-15 DIAGNOSIS — N200 Calculus of kidney: Secondary | ICD-10-CM | POA: Insufficient documentation

## 2020-12-15 DIAGNOSIS — Z780 Asymptomatic menopausal state: Secondary | ICD-10-CM | POA: Insufficient documentation

## 2020-12-15 DIAGNOSIS — E782 Mixed hyperlipidemia: Secondary | ICD-10-CM | POA: Insufficient documentation

## 2020-12-15 DIAGNOSIS — G894 Chronic pain syndrome: Secondary | ICD-10-CM | POA: Insufficient documentation

## 2020-12-15 DIAGNOSIS — F331 Major depressive disorder, recurrent, moderate: Secondary | ICD-10-CM | POA: Insufficient documentation

## 2020-12-15 NOTE — Progress Notes (Signed)
Subjective: Isabel George is a 70 y.o. female patient who presents to office for evaluation of left fifth toe pain reports that her toe is curling and has a lump injured it several years ago and toe heel this way reports that it rubs when she is in shoes the only shoes that helps is her crocs states that she has pain all over both feet sharp shooting tingling aching reports that she has a significant history of back problems and is wondering if this could be related to sciatica or a rheumatological issue since she has a family history of types of arthritis is and knows that she has arthritis all over.  Patient reports that she has tried stretching and cushions without improvement.  Patient denies any other pedal complaints at this time.  Review of systems noncontributory  Patient Active Problem List   Diagnosis Date Noted  . Abdominal aortic aneurysm without rupture (HCC) 12/15/2020  . Cardiovascular symptoms 12/15/2020  . Celiac disease 12/15/2020  . Chronic obstructive pulmonary disease (HCC) 12/15/2020  . Chronic pain syndrome 12/15/2020  . Degeneration of lumbar intervertebral disc 12/15/2020  . Essential hypertension 12/15/2020  . Generalized anxiety disorder 12/15/2020  . Generalized edema 12/15/2020  . Hardening of the aorta (main artery of the heart) (HCC) 12/15/2020  . Headache 12/15/2020  . Hypothyroidism 12/15/2020  . Impacted cerumen 12/15/2020  . Kidney stone 12/15/2020  . Menopause 12/15/2020  . Mixed hyperlipidemia 12/15/2020  . Moderate recurrent major depression (HCC) 12/15/2020  . Non-toxic multinodular goiter 12/15/2020  . Occlusion and stenosis of bilateral carotid arteries 12/15/2020  . Other long term (current) drug therapy 12/15/2020  . Other vitamin B12 deficiency anemias 12/15/2020  . Pain in left foot 12/15/2020  . Seasonal allergic rhinitis 12/15/2020  . Type 2 diabetes mellitus without complications (HCC) 12/15/2020  . Vitamin D deficiency 12/15/2020     Current Outpatient Medications on File Prior to Visit  Medication Sig Dispense Refill  . levothyroxine (SYNTHROID) 50 MCG tablet Take 50 mcg by mouth daily.    Marland Kitchen PROAIR HFA 108 (90 Base) MCG/ACT inhaler Inhale 2 puffs into the lungs every 6 (six) hours as needed for wheezing or shortness of breath.     No current facility-administered medications on file prior to visit.    Allergies  Allergen Reactions  . Penicillins Swelling and Other (See Comments)    "drug overdose feeling"   . Sulfa Antibiotics Other (See Comments)    Long lasting effect of unresponsiveness   . Ezetimibe Other (See Comments)  . Statins Other (See Comments)   Social History   Socioeconomic History  . Marital status: Widowed    Spouse name: Not on file  . Number of children: Not on file  . Years of education: Not on file  . Highest education level: Not on file  Occupational History  . Not on file  Tobacco Use  . Smoking status: Current Every Day Smoker    Packs/day: 0.50    Types: Cigarettes  . Smokeless tobacco: Never Used  Substance and Sexual Activity  . Alcohol use: Yes  . Drug use: Never  . Sexual activity: Not on file  Other Topics Concern  . Not on file  Social History Narrative  . Not on file   Social Determinants of Health   Financial Resource Strain: Not on file  Food Insecurity: Not on file  Transportation Needs: Not on file  Physical Activity: Not on file  Stress: Not on file  Social Connections:  Not on file    Family History  Problem Relation Age of Onset  . Angina Mother   . Throat cancer Mother   . Heart Problems Maternal Grandmother   . Asthma Grandson   . Heart Problems Son     Past Surgical History:  Procedure Laterality Date  . ABDOMINAL HYSTERECTOMY    . APPENDECTOMY    . BACK SURGERY    . CARPAL TUNNEL RELEASE Right   . CERVICAL SPINE SURGERY     C5-C7 fusion  . CHOLECYSTECTOMY      Objective:  General: Alert and oriented x3 in no acute  distress  Dermatology: Small hyperkeratotic lesion overlying left fifth PIPJ dorsally.  No open lesions bilateral lower extremities, no webspace macerations, no ecchymosis bilateral, all nails x 10 are thick but well manicured.  Vascular: Dorsalis Pedis and Posterior Tibial pedal pulses 2/4, Capillary Fill Time 3 seconds,(+) pedal hair growth bilateral, no edema bilateral lower extremities, Temperature gradient within normal limits.  Neurology: Gross sensation intact via light touch bilateral, subjective sharp shooting pains to feet.  Musculoskeletal: Rigid left fifth hammertoe.  Range of motion within normal limits at subtalar, Midtarsal, and MTPJ joints.  Patient has subjective pain all over to both feet however no direct pain with palpation reports that her feet does ache and hurt.  No pain with calf compression bilateral.  Strength within normal limits in all groups bilateral.   Gait: Unassisted, minimally antalgic wearing crocs  Xrays on CD Left foot: Consistent with digital contracture left fifth hammertoe and significant posterior heel spur at Achilles insertion.  No other fracture or dislocation.  Cager's triangle within normal limits.       Assessment and Plan: Problem List Items Addressed This Visit   None   Visit Diagnoses    Hammertoe of left foot    -  Primary   Corn of toe       Foot pain, bilateral           -Complete examination performed -Xrays reviewed on CD -Discussed treatement options for corn and hammertoe that is rigid in nature at left fifth toe -Patient opt for surgical management. Consent obtained for excision of corn and removal of bone at left fifth toe for hammertoe deformity repair.  Pre and Post op course explained. Risks, benefits, alternatives explained. No guarantees given or implied. Surgical booking slip submitted and provided patient with Surgical packet and info for [facility]. -Advised patient that she will need to be out of work a minimum 2 weeks or max  of 6 weeks depending on if her job allows her to go back early with light duty and wearing her surgical shoe -To dispense surgical shoe at surgical center -Meanwhile continue with good supportive shoes that do not rub toe and cushioning and padding as tolerated -Advised patient for other aches and pains may want to follow-up with primary doctor or rheumatologist patient reports that she is in the process of getting a rheumatologist to check for any other inflammatory sources of pain also since patient has a significant back history I advised patient that some of her pain could be related to her back problems -Patient to return to office after surgery or sooner if condition worsens.  Asencion Islam, DPM

## 2020-12-22 ENCOUNTER — Ambulatory Visit (INDEPENDENT_AMBULATORY_CARE_PROVIDER_SITE_OTHER): Payer: Medicare Other

## 2020-12-22 DIAGNOSIS — J309 Allergic rhinitis, unspecified: Secondary | ICD-10-CM

## 2020-12-30 ENCOUNTER — Ambulatory Visit (INDEPENDENT_AMBULATORY_CARE_PROVIDER_SITE_OTHER): Payer: Medicare Other | Admitting: *Deleted

## 2020-12-30 DIAGNOSIS — J309 Allergic rhinitis, unspecified: Secondary | ICD-10-CM

## 2021-01-08 ENCOUNTER — Other Ambulatory Visit: Payer: Self-pay | Admitting: Sports Medicine

## 2021-01-08 DIAGNOSIS — M2042 Other hammer toe(s) (acquired), left foot: Secondary | ICD-10-CM

## 2021-01-08 DIAGNOSIS — Z9889 Other specified postprocedural states: Secondary | ICD-10-CM

## 2021-01-08 NOTE — Progress Notes (Signed)
Post op meds entered 

## 2021-01-11 ENCOUNTER — Telehealth: Payer: Self-pay

## 2021-01-11 ENCOUNTER — Encounter: Payer: Self-pay | Admitting: Sports Medicine

## 2021-01-11 DIAGNOSIS — L84 Corns and callosities: Secondary | ICD-10-CM

## 2021-01-11 DIAGNOSIS — M2042 Other hammer toe(s) (acquired), left foot: Secondary | ICD-10-CM

## 2021-01-11 MED ORDER — PROMETHAZINE HCL 12.5 MG PO TABS: 12.5000 mg | ORAL_TABLET | Freq: Three times a day (TID) | ORAL | 0 refills | Status: DC | PRN

## 2021-01-11 MED ORDER — DOCUSATE SODIUM 100 MG PO CAPS: 100.0000 mg | ORAL_CAPSULE | Freq: Two times a day (BID) | ORAL | 0 refills | Status: DC

## 2021-01-11 MED ORDER — HYDROCODONE-ACETAMINOPHEN 10-325 MG PO TABS: 1.0000 | ORAL_TABLET | Freq: Four times a day (QID) | ORAL | 0 refills | Status: AC | PRN

## 2021-01-11 NOTE — Telephone Encounter (Signed)
Received a message from [place] with [facility]. She stated Corrie Dandy refused home health care for her surgery with Dr. Marylene Land on 01/11/2021. Cala Bradford just wanted [place] to be aware.

## 2021-01-12 ENCOUNTER — Telehealth: Payer: Self-pay | Admitting: Sports Medicine

## 2021-01-12 NOTE — Telephone Encounter (Signed)
Postoperative check phone call made to patient.  Patient reports that she is doing good.  Denies any current symptoms.  Advised patient to continue with rest elevation and keeping dressing clean dry and intact until her next office visit on next week.  I encourage patient to call office back if there are any postoperative questions or concerns meanwhile.  Patient expressed understanding and thanked me for calling. -Dr. Marylene Land

## 2021-01-19 ENCOUNTER — Encounter: Payer: Self-pay | Admitting: Sports Medicine

## 2021-01-19 ENCOUNTER — Ambulatory Visit (INDEPENDENT_AMBULATORY_CARE_PROVIDER_SITE_OTHER): Payer: Medicare Other | Admitting: Sports Medicine

## 2021-01-19 ENCOUNTER — Other Ambulatory Visit: Payer: Self-pay | Admitting: Sports Medicine

## 2021-01-19 ENCOUNTER — Ambulatory Visit (INDEPENDENT_AMBULATORY_CARE_PROVIDER_SITE_OTHER): Payer: Medicare Other

## 2021-01-19 ENCOUNTER — Other Ambulatory Visit: Payer: Self-pay

## 2021-01-19 VITALS — BP 128/73 | HR 72 | Temp 97.5°F | Resp 16

## 2021-01-19 DIAGNOSIS — M2042 Other hammer toe(s) (acquired), left foot: Secondary | ICD-10-CM

## 2021-01-19 DIAGNOSIS — Z9889 Other specified postprocedural states: Secondary | ICD-10-CM

## 2021-01-19 DIAGNOSIS — J309 Allergic rhinitis, unspecified: Secondary | ICD-10-CM

## 2021-01-19 DIAGNOSIS — L84 Corns and callosities: Secondary | ICD-10-CM

## 2021-01-19 NOTE — Progress Notes (Signed)
Subjective: Isabel George is a 70 y.o. female patient seen today in office for POV #1 (DOS 01-11-21), S/P Left 5th hammertoe repain. Patient denies pain at surgical site, but gets pain at 1st and 2nd toe occasionally, denies calf pain, denies headache, chest pain, shortness of breath, nausea, vomiting, fever, or chills. No other issues noted.   Patient Active Problem List   Diagnosis Date Noted   Abdominal aortic aneurysm without rupture (HCC) 12/15/2020   Cardiovascular symptoms 12/15/2020   Celiac disease 12/15/2020   Chronic obstructive pulmonary disease (HCC) 12/15/2020   Chronic pain syndrome 12/15/2020   Degeneration of lumbar intervertebral disc 12/15/2020   Essential hypertension 12/15/2020   Generalized anxiety disorder 12/15/2020   Generalized edema 12/15/2020   Hardening of the aorta (main artery of the heart) (HCC) 12/15/2020   Headache 12/15/2020   Hypothyroidism 12/15/2020   Impacted cerumen 12/15/2020   Kidney stone 12/15/2020   Menopause 12/15/2020   Mixed hyperlipidemia 12/15/2020   Moderate recurrent major depression (HCC) 12/15/2020   Non-toxic multinodular goiter 12/15/2020   Occlusion and stenosis of bilateral carotid arteries 12/15/2020   Other long term (current) drug therapy 12/15/2020   Other vitamin B12 deficiency anemias 12/15/2020   Pain in left foot 12/15/2020   Seasonal allergic rhinitis 12/15/2020   Type 2 diabetes mellitus without complications (HCC) 12/15/2020   Vitamin D deficiency 12/15/2020    Current Outpatient Medications on File Prior to Visit  Medication Sig Dispense Refill   HYDROcodone-acetaminophen (NORCO) 10-325 MG tablet      docusate sodium (COLACE) 100 MG capsule Take 1 capsule (100 mg total) by mouth 2 (two) times daily. 10 capsule 0   levothyroxine (SYNTHROID) 50 MCG tablet Take 50 mcg by mouth daily.     PROAIR HFA 108 (90 Base) MCG/ACT inhaler Inhale 2 puffs into the lungs every 6 (six) hours as needed for wheezing or shortness of  breath.     promethazine (PHENERGAN) 12.5 MG tablet Take 1 tablet (12.5 mg total) by mouth every 8 (eight) hours as needed for nausea or vomiting. 20 tablet 0   No current facility-administered medications on file prior to visit.    Allergies  Allergen Reactions   Penicillins Swelling and Other (See Comments)    "drug overdose feeling"    Sulfa Antibiotics Other (See Comments)    Long lasting effect of unresponsiveness    Ezetimibe Other (See Comments)   Statins Other (See Comments)    Objective: There were no vitals filed for this visit.  General: No acute distress, AAOx3  Left foot: Sutures intact with no gapping or dehiscence at surgical site, mild swelling to left 5th toe, no erythema, no warmth, no drainage, no signs of infection noted, Capillary fill time <3 seconds in all digits, gross sensation present via light touch to left foot. No current pain or crepitation with range of motion left foot but at night patient sometimes gets pains to 1-2 toes.  No pain with calf compression.   Post Op Xray, Left foot: Arthroplasty 5th toe, Soft tissue swelling within normal limits for post op status.   Assessment and Plan:  Problem List Items Addressed This Visit   None Visit Diagnoses     S/P foot surgery, left    -  Primary   Hammertoe of left foot       Corn of toe            -Patient seen and evaluated -Xrays reviewd  -Applied dry sterile dressing to  surgical site left foot secured with Coban wrap and stockinet  -Advised patient to make sure to keep dressings clean, dry, and intact  -Advised patient to continue with post-op shoe on left foot -Advised patient to limit activity to necessity  -Advised patient to ice and elevate as necessary  -Will plan for suture removal at next office visit. In the meantime, patient to call office if any issues or problems arise.   Asencion Islam, DPM

## 2021-01-26 ENCOUNTER — Ambulatory Visit (INDEPENDENT_AMBULATORY_CARE_PROVIDER_SITE_OTHER): Payer: Medicare Other | Admitting: *Deleted

## 2021-01-26 ENCOUNTER — Encounter: Payer: Self-pay | Admitting: Sports Medicine

## 2021-01-26 ENCOUNTER — Ambulatory Visit (INDEPENDENT_AMBULATORY_CARE_PROVIDER_SITE_OTHER): Payer: Medicare Other | Admitting: Sports Medicine

## 2021-01-26 ENCOUNTER — Other Ambulatory Visit: Payer: Self-pay

## 2021-01-26 DIAGNOSIS — L84 Corns and callosities: Secondary | ICD-10-CM

## 2021-01-26 DIAGNOSIS — J309 Allergic rhinitis, unspecified: Secondary | ICD-10-CM

## 2021-01-26 DIAGNOSIS — M79672 Pain in left foot: Secondary | ICD-10-CM

## 2021-01-26 DIAGNOSIS — Z9889 Other specified postprocedural states: Secondary | ICD-10-CM

## 2021-01-26 DIAGNOSIS — M2042 Other hammer toe(s) (acquired), left foot: Secondary | ICD-10-CM

## 2021-01-26 NOTE — Progress Notes (Signed)
Subjective: Isabel George is a 70 y.o. female patient seen today in office for POV # 2 (DOS 01-11-21), S/P Left 5th hammertoe repain. Patient denies pain at surgical site, reports that it feels good and she is ready to get her sutures out, denies calf pain, denies headache, chest pain, shortness of breath, nausea, vomiting, fever, or chills. No other issues noted.   Patient Active Problem List   Diagnosis Date Noted   Abdominal aortic aneurysm without rupture (HCC) 12/15/2020   Cardiovascular symptoms 12/15/2020   Celiac disease 12/15/2020   Chronic obstructive pulmonary disease (HCC) 12/15/2020   Chronic pain syndrome 12/15/2020   Degeneration of lumbar intervertebral disc 12/15/2020   Essential hypertension 12/15/2020   Generalized anxiety disorder 12/15/2020   Generalized edema 12/15/2020   Hardening of the aorta (main artery of the heart) (HCC) 12/15/2020   Headache 12/15/2020   Hypothyroidism 12/15/2020   Impacted cerumen 12/15/2020   Kidney stone 12/15/2020   Menopause 12/15/2020   Mixed hyperlipidemia 12/15/2020   Moderate recurrent major depression (HCC) 12/15/2020   Non-toxic multinodular goiter 12/15/2020   Occlusion and stenosis of bilateral carotid arteries 12/15/2020   Other long term (current) drug therapy 12/15/2020   Other vitamin B12 deficiency anemias 12/15/2020   Pain in left foot 12/15/2020   Seasonal allergic rhinitis 12/15/2020   Type 2 diabetes mellitus without complications (HCC) 12/15/2020   Vitamin D deficiency 12/15/2020    Current Outpatient Medications on File Prior to Visit  Medication Sig Dispense Refill   docusate sodium (COLACE) 100 MG capsule Take 1 capsule (100 mg total) by mouth 2 (two) times daily. 10 capsule 0   Ginger, Zingiber officinalis, (GINGER ROOT) 550 MG CAPS See admin instructions.     HYDROcodone-acetaminophen (NORCO) 10-325 MG tablet      levothyroxine (SYNTHROID) 50 MCG tablet Take 50 mcg by mouth daily.     PROAIR HFA 108 (90  Base) MCG/ACT inhaler Inhale 2 puffs into the lungs every 6 (six) hours as needed for wheezing or shortness of breath.     promethazine (PHENERGAN) 12.5 MG tablet Take 1 tablet (12.5 mg total) by mouth every 8 (eight) hours as needed for nausea or vomiting. 20 tablet 0   No current facility-administered medications on file prior to visit.    Allergies  Allergen Reactions   Penicillins Swelling and Other (See Comments)    "drug overdose feeling"    Sulfa Antibiotics Other (See Comments)    Long lasting effect of unresponsiveness    Ezetimibe Other (See Comments)   Statins Other (See Comments)    Objective: There were no vitals filed for this visit.  General: No acute distress, AAOx3  Left foot: Sutures intact with no gapping or dehiscence at surgical site, mild swelling and ecchymosis to left 5th toe, no erythema, no warmth, no drainage, no signs of infection noted, Capillary fill time <3 seconds in all digits, gross sensation present via light touch to left foot. No current pain or crepitation with range of motion.  No pain with calf compression.     Assessment and Plan:  Problem List Items Addressed This Visit   None Visit Diagnoses     S/P foot surgery, left    -  Primary   Hammertoe of left foot       Corn of toe       Left foot pain            -Patient seen and evaluated -Sutures removed -Applied dry sterile dressing  to surgical site left foot secured with Coban wrap and stockinet  -Advised patient to make sure to keep dressings clean, dry, and intact until tomorrow; on tomorrow may remove for a shower and then redress with a clean sock -Advised patient to slowly transition to a normal shoe that does not rub toe as tolerated may try her croc -Advised patient to limit activity to necessity  -Advised patient to ice and elevate as necessary  -Will plan for postop check and discuss return to work at next office visit. In the meantime, patient to call office if any issues  or problems arise.   Asencion Islam, DPM

## 2021-02-09 ENCOUNTER — Ambulatory Visit (INDEPENDENT_AMBULATORY_CARE_PROVIDER_SITE_OTHER): Payer: Medicare Other | Admitting: *Deleted

## 2021-02-09 ENCOUNTER — Encounter: Payer: Self-pay | Admitting: Sports Medicine

## 2021-02-09 ENCOUNTER — Other Ambulatory Visit: Payer: Self-pay

## 2021-02-09 ENCOUNTER — Ambulatory Visit (INDEPENDENT_AMBULATORY_CARE_PROVIDER_SITE_OTHER): Payer: Medicare Other | Admitting: Sports Medicine

## 2021-02-09 DIAGNOSIS — J309 Allergic rhinitis, unspecified: Secondary | ICD-10-CM | POA: Diagnosis not present

## 2021-02-09 DIAGNOSIS — M2042 Other hammer toe(s) (acquired), left foot: Secondary | ICD-10-CM

## 2021-02-09 DIAGNOSIS — Z9889 Other specified postprocedural states: Secondary | ICD-10-CM

## 2021-02-09 DIAGNOSIS — L84 Corns and callosities: Secondary | ICD-10-CM

## 2021-02-09 DIAGNOSIS — M79672 Pain in left foot: Secondary | ICD-10-CM

## 2021-02-09 NOTE — Progress Notes (Signed)
Subjective: Isabel George is a 70 y.o. female patient seen today in office for POV # 3 (DOS 01-11-21), S/P Left 5th hammertoe repain. Patient denies pain at surgical site, reports that everything feels good, in a normal shoe and is ready to return to work. No other issues noted.   Patient Active Problem List   Diagnosis Date Noted   Abdominal aortic aneurysm without rupture (HCC) 12/15/2020   Cardiovascular symptoms 12/15/2020   Celiac disease 12/15/2020   Chronic obstructive pulmonary disease (HCC) 12/15/2020   Chronic pain syndrome 12/15/2020   Degeneration of lumbar intervertebral disc 12/15/2020   Essential hypertension 12/15/2020   Generalized anxiety disorder 12/15/2020   Generalized edema 12/15/2020   Hardening of the aorta (main artery of the heart) (HCC) 12/15/2020   Headache 12/15/2020   Hypothyroidism 12/15/2020   Impacted cerumen 12/15/2020   Kidney stone 12/15/2020   Menopause 12/15/2020   Mixed hyperlipidemia 12/15/2020   Moderate recurrent major depression (HCC) 12/15/2020   Non-toxic multinodular goiter 12/15/2020   Occlusion and stenosis of bilateral carotid arteries 12/15/2020   Other long term (current) drug therapy 12/15/2020   Other vitamin B12 deficiency anemias 12/15/2020   Pain in left foot 12/15/2020   Seasonal allergic rhinitis 12/15/2020   Type 2 diabetes mellitus without complications (HCC) 12/15/2020   Vitamin D deficiency 12/15/2020    Current Outpatient Medications on File Prior to Visit  Medication Sig Dispense Refill   docusate sodium (COLACE) 100 MG capsule Take 1 capsule (100 mg total) by mouth 2 (two) times daily. 10 capsule 0   Ginger, Zingiber officinalis, (GINGER ROOT) 550 MG CAPS See admin instructions.     HYDROcodone-acetaminophen (NORCO) 10-325 MG tablet      levothyroxine (SYNTHROID) 50 MCG tablet Take 50 mcg by mouth daily.     PROAIR HFA 108 (90 Base) MCG/ACT inhaler Inhale 2 puffs into the lungs every 6 (six) hours as needed for  wheezing or shortness of breath.     promethazine (PHENERGAN) 12.5 MG tablet Take 1 tablet (12.5 mg total) by mouth every 8 (eight) hours as needed for nausea or vomiting. 20 tablet 0   No current facility-administered medications on file prior to visit.    Allergies  Allergen Reactions   Penicillins Swelling and Other (See Comments)    "drug overdose feeling"    Sulfa Antibiotics Other (See Comments)    Long lasting effect of unresponsiveness    Ezetimibe Other (See Comments)   Statins Other (See Comments)    Objective: There were no vitals filed for this visit.  General: No acute distress, AAOx3  Left foot: Incision well healed, no erythema, no warmth, no drainage, no signs of infection noted, Capillary fill time <3 seconds in all digits, gross sensation present via light touch to left foot. No current pain or crepitation with range of motion.  No pain with calf compression.     Assessment and Plan:  Problem List Items Addressed This Visit   None Visit Diagnoses     S/P foot surgery, left    -  Primary   Hammertoe of left foot       Corn of toe       Left foot pain            -Patient seen and evaluated -Incision well healed -Advised OTC scar creams or vit E -Advised patient to use good supportive shoes daily -May return to work no restrictions  -Return PRN. In the meantime, patient to call office  if any issues or problems arise.   Landis Martins, DPM

## 2021-02-17 DIAGNOSIS — J302 Other seasonal allergic rhinitis: Secondary | ICD-10-CM

## 2021-02-17 NOTE — Progress Notes (Signed)
VIAL EXP 02-17-22

## 2021-02-25 ENCOUNTER — Ambulatory Visit (INDEPENDENT_AMBULATORY_CARE_PROVIDER_SITE_OTHER): Payer: Medicare Other | Admitting: *Deleted

## 2021-02-25 DIAGNOSIS — J309 Allergic rhinitis, unspecified: Secondary | ICD-10-CM

## 2021-03-09 ENCOUNTER — Ambulatory Visit (INDEPENDENT_AMBULATORY_CARE_PROVIDER_SITE_OTHER): Payer: Medicare Other | Admitting: *Deleted

## 2021-03-09 DIAGNOSIS — J309 Allergic rhinitis, unspecified: Secondary | ICD-10-CM | POA: Diagnosis not present

## 2021-03-31 ENCOUNTER — Ambulatory Visit (INDEPENDENT_AMBULATORY_CARE_PROVIDER_SITE_OTHER): Payer: Medicare Other | Admitting: *Deleted

## 2021-03-31 DIAGNOSIS — J309 Allergic rhinitis, unspecified: Secondary | ICD-10-CM | POA: Diagnosis not present

## 2021-04-14 ENCOUNTER — Ambulatory Visit (INDEPENDENT_AMBULATORY_CARE_PROVIDER_SITE_OTHER): Payer: Medicare Other | Admitting: *Deleted

## 2021-04-14 DIAGNOSIS — J309 Allergic rhinitis, unspecified: Secondary | ICD-10-CM

## 2021-04-29 ENCOUNTER — Ambulatory Visit (INDEPENDENT_AMBULATORY_CARE_PROVIDER_SITE_OTHER): Payer: Medicare Other | Admitting: Allergy and Immunology

## 2021-04-29 ENCOUNTER — Encounter: Payer: Self-pay | Admitting: Allergy and Immunology

## 2021-04-29 ENCOUNTER — Other Ambulatory Visit: Payer: Self-pay

## 2021-04-29 VITALS — BP 130/82 | HR 84 | Resp 16

## 2021-04-29 DIAGNOSIS — F17200 Nicotine dependence, unspecified, uncomplicated: Secondary | ICD-10-CM

## 2021-04-29 DIAGNOSIS — J3089 Other allergic rhinitis: Secondary | ICD-10-CM | POA: Diagnosis not present

## 2021-04-29 DIAGNOSIS — J309 Allergic rhinitis, unspecified: Secondary | ICD-10-CM

## 2021-04-29 DIAGNOSIS — J449 Chronic obstructive pulmonary disease, unspecified: Secondary | ICD-10-CM

## 2021-04-29 MED ORDER — METHYLPREDNISOLONE ACETATE 80 MG/ML IJ SUSP
80.0000 mg | Freq: Once | INTRAMUSCULAR | Status: AC
  Administered 2021-04-29: 80 mg via INTRAMUSCULAR

## 2021-04-29 NOTE — Patient Instructions (Addendum)
  1.  Continue to perform allergen avoidance measures - mold  2.  Continue to treat and prevent inflammation:   A.  AirDuo 232 - 1 inhalations 2 times per day  B.  OTC Nasacort -1 spray each nostril 1 time per day  C.  Immunotherapy  3.  If needed:   A.  Cetirizine 10 mg -1 tablet 1-2 times a day  B.  Albuterol HFA -2 inhalations every 4-6 hours  C.  Pataday -1 drop each eye 1 time per day  4.  Consider using nicotine substitutes to replace tobacco smoke exposure  5.  For this recent episode:   A. Depomedrol 80 mg IM delivered in clinic today  B. Can add OTC Mucinex DM - 1-2 times per day  6.  Return to clinic in 6 months or earlier if problem

## 2021-04-29 NOTE — Progress Notes (Signed)
[place] [facility] [place] - [place] - [place] - [place]   Follow-up Note  Referring Provider: Galvin Proffer, MD Primary Provider: Galvin Proffer, MD Date of Office Visit: 04/29/2021  Subjective:   Isabel George (DOB: [birth year]) is a 70 y.o. 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  I last saw her in this clinic on 23 April 2020.  She is undergoing a course of immunotherapy currently at every 2 weeks without any adverse effect.  She does believe that immunotherapy has certainly helped her with her upper and lower airway symptoms that she has gone through each season of the year.  In fact, since I have seen her in this clinic she has not required a systemic steroid and antibiotic and rarely uses a short acting bronchodilator.  However, 1 month ago she developed a nasal issue with nasal congestion and sneezing and a little bit of a sore throat with lots of postnasal drip and cough.  She was treated with a Z-Pak and she apparently had a chest x-ray which was normal.  She is better but she still has some coughing and hacking and drainage in her throat.  She does not have any ugly nasal discharge or shortness of breath or chest tightness or sputum production or chest pain.  She does not use much triple inhaler as she does not really think that this helps her to any degree.  She does occasionally use a nasal steroid.  She still continues to smoke 10 cigarettes/day.  She will be leaving in November 2020 for [place] and staying there until April 2023.  Allergies as of 04/29/2021       Reactions   Penicillins Swelling, Other (See Comments)   "drug overdose feeling"   Sulfa Antibiotics Other (See Comments)   Long lasting effect of unresponsiveness   Ezetimibe Other (See Comments)   Statins Other (See Comments)         Medication List    docusate sodium 100 MG capsule Commonly known as: Colace Take 1 capsule (100 mg total) by mouth 2 (two) times daily.   Ginger Root 550 MG Caps See admin instructions.   HYDROcodone-acetaminophen 10-325 MG tablet Commonly known as: NORCO   levothyroxine 50 MCG tablet Commonly known as: SYNTHROID Take 50 mcg by mouth daily.   ProAir HFA 108 (90 Base) MCG/ACT inhaler Generic drug: albuterol Inhale 2 puffs into the lungs every 6 (six) hours as needed for wheezing or shortness of breath.   promethazine 12.5 MG tablet Commonly known as: PHENERGAN Take 1 tablet (12.5 mg total) by mouth every 8 (eight) hours as needed for nausea or vomiting.    Past Medical History:  Diagnosis Date   Asthma    Hypothyroidism    Tobacco abuse     Past Surgical History:  Procedure Laterality Date   ABDOMINAL HYSTERECTOMY     APPENDECTOMY     BACK SURGERY     CARPAL TUNNEL RELEASE Right    CERVICAL SPINE SURGERY     C5-C7 fusion   CHOLECYSTECTOMY      Review of systems negative except as noted in HPI / PMHx or noted below:  Review of Systems  Constitutional: Negative.   HENT: Negative.    Eyes: Negative.   Respiratory: Negative.    Cardiovascular: Negative.   Gastrointestinal: Negative.   Genitourinary:  Negative.   Musculoskeletal: Negative.   Skin: Negative.   Neurological: Negative.   Endo/Heme/Allergies: Negative.   Psychiatric/Behavioral: Negative.      Objective:   Vitals:   04/29/21 1130  BP: 130/82  Pulse: 84  Resp: 16  SpO2: 94%          Physical Exam Constitutional:      Appearance: She is not diaphoretic.  HENT:     Head: Normocephalic.     Right Ear: Tympanic membrane, ear canal and external ear normal.     Left Ear: Tympanic membrane, ear canal and external ear normal.     Nose: Nose normal. No mucosal edema or rhinorrhea.     Mouth/Throat:     Pharynx: Uvula midline. No oropharyngeal exudate.  Eyes:     Conjunctiva/sclera:  Conjunctivae normal.  Neck:     Thyroid: No thyromegaly.     Trachea: Trachea normal. No tracheal tenderness or tracheal deviation.  Cardiovascular:     Rate and Rhythm: Normal rate and regular rhythm.     Heart sounds: Normal heart sounds, S1 normal and S2 normal. No murmur heard. Pulmonary:     Effort: No respiratory distress.     Breath sounds: Normal breath sounds. No stridor. No wheezing or rales.  Lymphadenopathy:     Head:     Right side of head: No tonsillar adenopathy.     Left side of head: No tonsillar adenopathy.     Cervical: No cervical adenopathy.  Skin:    Findings: No erythema or rash.     Nails: There is no clubbing.  Neurological:     Mental Status: She is alert.    Diagnostics: none  Assessment and Plan:   1. COPD with asthma (HCC)   2. Perennial allergic rhinitis   3. Heavy tobacco smoker   4. Allergic rhinitis, unspecified seasonality, unspecified trigger     1.  Continue to perform allergen avoidance measures - mold  2.  Continue to treat and prevent inflammation:   A.  AirDuo 232 - 1 inhalations 2 times per day  B.  OTC Nasacort -1 spray each nostril 1 time per day  C.  Immunotherapy  3.  If needed:   A.  Cetirizine 10 mg -1 tablet 1-2 times a day  B.  Albuterol HFA -2 inhalations every 4-6 hours  C.  Pataday -1 drop each eye 1 time per day  4.  Consider using nicotine substitutes to replace tobacco smoke exposure  5.  For this recent episode:   A. Depomedrol 80 mg IM delivered in clinic today  B. Can add OTC Mucinex DM - 1-2 times per day  6.  Return to clinic in 6 months or earlier if problem  I have given Isabel George a systemic steroid to help with some of the inflammation and irritation of her airway that appeared to be precipitated by a viral respiratory tract infection about a month ago.  And I have encouraged her to consistently use an anti-inflammatory agent for both her upper and lower airway and continue on immunotherapy and of course  eliminate tobacco smoke exposure.  Assuming she does well with this plan she can continue on this plan and I will see her back in this clinic in 6 months or earlier if there is a problem.  Isabel Schimke, MD Allergy / Immunology [facility]

## 2021-05-03 ENCOUNTER — Encounter: Payer: Self-pay | Admitting: Allergy and Immunology

## 2021-05-05 ENCOUNTER — Ambulatory Visit: Payer: Medicare Other | Admitting: Allergy and Immunology

## 2021-05-06 ENCOUNTER — Ambulatory Visit (INDEPENDENT_AMBULATORY_CARE_PROVIDER_SITE_OTHER): Payer: Medicare Other | Admitting: *Deleted

## 2021-05-06 DIAGNOSIS — J309 Allergic rhinitis, unspecified: Secondary | ICD-10-CM

## 2021-05-12 ENCOUNTER — Ambulatory Visit (INDEPENDENT_AMBULATORY_CARE_PROVIDER_SITE_OTHER): Payer: Medicare Other | Admitting: *Deleted

## 2021-05-12 DIAGNOSIS — J309 Allergic rhinitis, unspecified: Secondary | ICD-10-CM

## 2021-05-18 ENCOUNTER — Ambulatory Visit (INDEPENDENT_AMBULATORY_CARE_PROVIDER_SITE_OTHER): Payer: Medicare Other

## 2021-05-18 DIAGNOSIS — J309 Allergic rhinitis, unspecified: Secondary | ICD-10-CM | POA: Diagnosis not present

## 2021-06-03 ENCOUNTER — Ambulatory Visit (INDEPENDENT_AMBULATORY_CARE_PROVIDER_SITE_OTHER): Payer: Medicare Other | Admitting: *Deleted

## 2021-06-03 DIAGNOSIS — J309 Allergic rhinitis, unspecified: Secondary | ICD-10-CM

## 2021-08-24 ENCOUNTER — Ambulatory Visit (INDEPENDENT_AMBULATORY_CARE_PROVIDER_SITE_OTHER): Payer: Medicare Other | Admitting: *Deleted

## 2021-08-24 DIAGNOSIS — J309 Allergic rhinitis, unspecified: Secondary | ICD-10-CM

## 2021-09-02 ENCOUNTER — Ambulatory Visit (INDEPENDENT_AMBULATORY_CARE_PROVIDER_SITE_OTHER): Payer: Medicare Other | Admitting: *Deleted

## 2021-09-02 DIAGNOSIS — J309 Allergic rhinitis, unspecified: Secondary | ICD-10-CM | POA: Diagnosis not present

## 2021-09-13 ENCOUNTER — Ambulatory Visit (INDEPENDENT_AMBULATORY_CARE_PROVIDER_SITE_OTHER): Payer: Medicare Other | Admitting: *Deleted

## 2021-09-13 DIAGNOSIS — J309 Allergic rhinitis, unspecified: Secondary | ICD-10-CM

## 2021-09-23 ENCOUNTER — Ambulatory Visit (INDEPENDENT_AMBULATORY_CARE_PROVIDER_SITE_OTHER): Payer: Medicare Other | Admitting: *Deleted

## 2021-09-23 DIAGNOSIS — J309 Allergic rhinitis, unspecified: Secondary | ICD-10-CM

## 2021-09-29 ENCOUNTER — Ambulatory Visit (INDEPENDENT_AMBULATORY_CARE_PROVIDER_SITE_OTHER): Payer: Medicare Other | Admitting: *Deleted

## 2021-09-29 DIAGNOSIS — J309 Allergic rhinitis, unspecified: Secondary | ICD-10-CM | POA: Diagnosis not present

## 2021-10-07 ENCOUNTER — Ambulatory Visit (INDEPENDENT_AMBULATORY_CARE_PROVIDER_SITE_OTHER): Payer: Medicare Other

## 2021-10-07 DIAGNOSIS — J309 Allergic rhinitis, unspecified: Secondary | ICD-10-CM

## 2021-10-12 DIAGNOSIS — J302 Other seasonal allergic rhinitis: Secondary | ICD-10-CM | POA: Diagnosis not present

## 2021-10-12 NOTE — Progress Notes (Signed)
VIAL EXP 10-13-22 ?

## 2021-10-20 ENCOUNTER — Ambulatory Visit (INDEPENDENT_AMBULATORY_CARE_PROVIDER_SITE_OTHER): Payer: Medicare Other | Admitting: *Deleted

## 2021-10-20 DIAGNOSIS — J309 Allergic rhinitis, unspecified: Secondary | ICD-10-CM | POA: Diagnosis not present

## 2021-11-04 ENCOUNTER — Other Ambulatory Visit: Payer: Self-pay | Admitting: Allergy and Immunology

## 2021-11-10 ENCOUNTER — Ambulatory Visit (INDEPENDENT_AMBULATORY_CARE_PROVIDER_SITE_OTHER): Payer: Medicare Other | Admitting: *Deleted

## 2021-11-10 DIAGNOSIS — J309 Allergic rhinitis, unspecified: Secondary | ICD-10-CM

## 2021-11-24 ENCOUNTER — Ambulatory Visit (INDEPENDENT_AMBULATORY_CARE_PROVIDER_SITE_OTHER): Payer: Medicare Other | Admitting: *Deleted

## 2021-11-24 DIAGNOSIS — J309 Allergic rhinitis, unspecified: Secondary | ICD-10-CM

## 2021-12-01 ENCOUNTER — Ambulatory Visit (INDEPENDENT_AMBULATORY_CARE_PROVIDER_SITE_OTHER): Payer: Medicare Other | Admitting: *Deleted

## 2021-12-01 DIAGNOSIS — J309 Allergic rhinitis, unspecified: Secondary | ICD-10-CM

## 2021-12-02 ENCOUNTER — Ambulatory Visit (INDEPENDENT_AMBULATORY_CARE_PROVIDER_SITE_OTHER): Payer: Medicare Other | Admitting: Allergy and Immunology

## 2021-12-02 ENCOUNTER — Encounter: Payer: Self-pay | Admitting: Allergy and Immunology

## 2021-12-02 VITALS — BP 118/82 | HR 88 | Resp 16 | Ht 63.5 in | Wt 165.6 lb

## 2021-12-02 DIAGNOSIS — F17200 Nicotine dependence, unspecified, uncomplicated: Secondary | ICD-10-CM

## 2021-12-02 DIAGNOSIS — J45901 Unspecified asthma with (acute) exacerbation: Secondary | ICD-10-CM

## 2021-12-02 DIAGNOSIS — J441 Chronic obstructive pulmonary disease with (acute) exacerbation: Secondary | ICD-10-CM

## 2021-12-02 DIAGNOSIS — J3089 Other allergic rhinitis: Secondary | ICD-10-CM

## 2021-12-02 MED ORDER — METHYLPREDNISOLONE ACETATE 40 MG/ML IJ SUSP
40.0000 mg | Freq: Once | INTRAMUSCULAR | Status: AC
  Administered 2021-12-02: 40 mg via INTRAMUSCULAR

## 2021-12-02 MED ORDER — FLUTICASONE-SALMETEROL 250-50 MCG/ACT IN AEPB: 1.0000 | INHALATION_SPRAY | Freq: Two times a day (BID) | RESPIRATORY_TRACT | 0 refills | Status: DC

## 2021-12-02 NOTE — Patient Instructions (Signed)
?  1.  Continue to perform allergen avoidance measures - mold ? ?2.  Continue to treat and prevent inflammation: ? ? A.  Advair 250 - 1 inhalations 2 times per day ? B.  OTC Nasacort -1 spray each nostril 1 time per day ? C.  Immunotherapy ? D.  Depomedrol 40 mg IM delivered in clinic today ? ?3.  If needed: ? ? A.  Cetirizine 10 mg -1 tablet 1-2 times a day ? B.  Albuterol HFA -2 inhalations every 4-6 hours ? C.  Pataday -1 drop each eye 1 time per day ? ?4.  Consider using nicotine substitutes to replace tobacco smoke exposure ? ?5.  Return to clinic in 6 months or earlier if problem ?

## 2021-12-02 NOTE — Progress Notes (Signed)
? ?[facility] ? ? ?Follow-up Note ? ?Referring Provider: Bonnita Nasuti, MD ?Primary Provider: Bonnita Nasuti, MD ?Date of Office Visit: 12/02/2021 ? ?Subjective:  ? ?Isabel George (DOB: Feb 22, 1951) is a 71 y.o. female who returns to the [facility] on 12/02/2021 in re-evaluation of the following: ? 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  I last saw her in this clinic on 29 April 2021. ? ?Since the pollen has arrived over the course of the past few weeks she has had lots of problems with nasal drip and nasal congestion and sneezing and itchy eyes and itchy nose and some coughing and choking.  Her choking is actually more of a postnasal drip and mucus stuck in her throat and it is choking with food. ? ?Prior to this point time she was doing relatively well while using a collection of anti-inflammatory agents for her airway and immunotherapy.  Currently her immunotherapy is every week without any adverse effect.   ? ?She still continues to smoke extensively. ? ?Allergies as of 12/02/2021   ? ?   Reactions  ? Penicillins Swelling, Other (See Comments)  ? "drug overdose feeling"  ? Sulfa Antibiotics Other (See Comments)  ? Long lasting effect of unresponsiveness  ? Ezetimibe Other (See Comments)  ? Statins Other (See Comments)  ? ?  ? ?  ?Medication List  ? ? ?Ginger Root 550 MG Caps ?See admin instructions. ?  ?levothyroxine 50 MCG tablet ?Commonly known as: SYNTHROID ?Take 50 mcg by mouth daily. ?  ?ProAir HFA 108 (90 Base) MCG/ACT inhaler ?Generic drug: albuterol ?Inhale 2 puffs into the lungs every 6 (six) hours as needed for wheezing or shortness of breath. ?  ? ?Past Medical History:  ?Diagnosis Date  ? Asthma   ? Hypothyroidism   ? Tobacco abuse   ? ? ?Past Surgical History:  ?Procedure Laterality Date  ? ABDOMINAL HYSTERECTOMY    ? APPENDECTOMY    ? BACK SURGERY    ? CARPAL  TUNNEL RELEASE Right   ? CERVICAL SPINE SURGERY    ? C5-C7 fusion  ? CHOLECYSTECTOMY    ? ? ?Review of systems negative except as noted in HPI / PMHx or noted below: ? ?Review of Systems  ?Constitutional: Negative.   ?HENT: Negative.    ?Eyes: Negative.   ?Respiratory: Negative.    ?Cardiovascular: Negative.   ?Gastrointestinal: Negative.   ?Genitourinary: Negative.   ?Musculoskeletal: Negative.   ?Skin: Negative.   ?Neurological: Negative.   ?Endo/Heme/Allergies: Negative.   ?Psychiatric/Behavioral: Negative.    ? ? ?Objective:  ? ?Vitals:  ? 12/02/21 1552  ?BP: 118/82  ?Pulse: 88  ?Resp: 16  ?SpO2: 94%  ? ?Height: 5' 3.5" (161.3 cm)  ?Weight: 165 lb 9.6 oz (75.1 kg)  ? ?Physical Exam ?Constitutional:   ?   Appearance: She is not diaphoretic.  ?HENT:  ?   Head: Normocephalic.  ?   Right Ear: Tympanic membrane, ear canal and external ear normal.  ?   Left Ear: Tympanic membrane, ear canal and external ear normal.  ?   Nose: Nose normal. No mucosal edema or rhinorrhea.  ?   Mouth/Throat:  ?   Pharynx: Uvula midline. No oropharyngeal exudate.  ?Eyes:  ?   Conjunctiva/sclera: Conjunctivae normal.  ?Neck:  ?   Thyroid: No thyromegaly.  ?   Trachea: Trachea normal. No tracheal tenderness  or tracheal deviation.  ?Cardiovascular:  ?   Rate and Rhythm: Normal rate and regular rhythm.  ?   Heart sounds: Normal heart sounds, S1 normal and S2 normal. No murmur heard. ?Pulmonary:  ?   Effort: No respiratory distress.  ?   Breath sounds: Normal breath sounds. No stridor. No wheezing or rales.  ?Lymphadenopathy:  ?   Head:  ?   Right side of head: No tonsillar adenopathy.  ?   Left side of head: No tonsillar adenopathy.  ?   Cervical: No cervical adenopathy.  ?Skin: ?   Findings: No erythema or rash.  ?   Nails: There is no clubbing.  ?Neurological:  ?   Mental Status: She is alert.  ? ? ?Diagnostics:  ?  ?Spirometry was performed and demonstrated an FEV1 of 1.09 at 52 % of predicted. ? ?Assessment and Plan:  ? ?1. Asthma with  COPD with exacerbation ([place])   ?2. Perennial allergic rhinitis   ?3. Heavy tobacco smoker   ? ? ?1.  Continue to perform allergen avoidance measures - mold ? ?2.  Continue to treat and prevent inflammation: ? ? A.  Advair 250 - 1 inhalations 2 times per day ? B.  OTC Nasacort -1 spray each nostril 1 time per day ? C.  Immunotherapy ? D.  Depomedrol 40 mg IM delivered in clinic today ? ?3.  If needed: ? ? A.  Cetirizine 10 mg -1 tablet 1-2 times a day ? B.  Albuterol HFA -2 inhalations every 4-6 hours ? C.  Pataday -1 drop each eye 1 time per day ? ?4.  Consider using nicotine substitutes to replace tobacco smoke exposure ? ?5.  Return to clinic in 6 months or earlier if problem ? ?Brennah has a flareup of her atopic respiratory disease and we will treat her with a systemic steroid while she continues on a collection of anti-inflammatory agents for her airway including immunotherapy.  Because of an insurance issue we had to change her combination inhaler to Advair.  Of course she should stop smoking and I had a discussion with her today about eliminating that tobacco hobby.  I will see her back in this clinic in 6 months or earlier if there is a problem. ? ?Allena Katz, MD ?Allergy / Immunology ?[facility] ?

## 2021-12-06 ENCOUNTER — Encounter: Payer: Self-pay | Admitting: Allergy and Immunology

## 2021-12-15 ENCOUNTER — Ambulatory Visit (INDEPENDENT_AMBULATORY_CARE_PROVIDER_SITE_OTHER): Payer: Medicare Other | Admitting: *Deleted

## 2021-12-15 DIAGNOSIS — J309 Allergic rhinitis, unspecified: Secondary | ICD-10-CM | POA: Diagnosis not present

## 2021-12-22 ENCOUNTER — Ambulatory Visit (INDEPENDENT_AMBULATORY_CARE_PROVIDER_SITE_OTHER): Payer: Medicare Other | Admitting: *Deleted

## 2021-12-22 ENCOUNTER — Other Ambulatory Visit: Payer: Self-pay | Admitting: *Deleted

## 2021-12-22 DIAGNOSIS — J309 Allergic rhinitis, unspecified: Secondary | ICD-10-CM | POA: Diagnosis not present

## 2021-12-22 MED ORDER — FLUTICASONE-SALMETEROL 250-50 MCG/ACT IN AEPB: 1.0000 | INHALATION_SPRAY | Freq: Two times a day (BID) | RESPIRATORY_TRACT | 1 refills | Status: DC

## 2021-12-28 ENCOUNTER — Other Ambulatory Visit: Payer: Self-pay | Admitting: Allergy and Immunology

## 2021-12-29 ENCOUNTER — Ambulatory Visit (INDEPENDENT_AMBULATORY_CARE_PROVIDER_SITE_OTHER): Payer: Medicare Other | Admitting: *Deleted

## 2021-12-29 DIAGNOSIS — J309 Allergic rhinitis, unspecified: Secondary | ICD-10-CM | POA: Diagnosis not present

## 2022-01-05 ENCOUNTER — Ambulatory Visit (INDEPENDENT_AMBULATORY_CARE_PROVIDER_SITE_OTHER): Payer: Medicare Other | Admitting: *Deleted

## 2022-01-05 DIAGNOSIS — J309 Allergic rhinitis, unspecified: Secondary | ICD-10-CM

## 2022-01-19 ENCOUNTER — Ambulatory Visit (INDEPENDENT_AMBULATORY_CARE_PROVIDER_SITE_OTHER): Payer: Medicare Other | Admitting: *Deleted

## 2022-01-19 DIAGNOSIS — J309 Allergic rhinitis, unspecified: Secondary | ICD-10-CM

## 2022-02-02 ENCOUNTER — Ambulatory Visit (INDEPENDENT_AMBULATORY_CARE_PROVIDER_SITE_OTHER): Payer: Medicare Other | Admitting: *Deleted

## 2022-02-02 DIAGNOSIS — J309 Allergic rhinitis, unspecified: Secondary | ICD-10-CM | POA: Diagnosis not present

## 2022-02-08 DIAGNOSIS — J302 Other seasonal allergic rhinitis: Secondary | ICD-10-CM

## 2022-02-08 NOTE — Progress Notes (Signed)
VIAL EXP 02-09-23 

## 2022-02-16 ENCOUNTER — Ambulatory Visit (INDEPENDENT_AMBULATORY_CARE_PROVIDER_SITE_OTHER): Payer: Medicare Other | Admitting: *Deleted

## 2022-02-16 DIAGNOSIS — J309 Allergic rhinitis, unspecified: Secondary | ICD-10-CM | POA: Diagnosis not present

## 2022-03-02 ENCOUNTER — Ambulatory Visit (INDEPENDENT_AMBULATORY_CARE_PROVIDER_SITE_OTHER): Payer: Medicare Other | Admitting: *Deleted

## 2022-03-02 DIAGNOSIS — J309 Allergic rhinitis, unspecified: Secondary | ICD-10-CM

## 2022-03-16 ENCOUNTER — Ambulatory Visit (INDEPENDENT_AMBULATORY_CARE_PROVIDER_SITE_OTHER): Payer: Medicare Other | Admitting: *Deleted

## 2022-03-16 DIAGNOSIS — J309 Allergic rhinitis, unspecified: Secondary | ICD-10-CM | POA: Diagnosis not present

## 2022-03-30 ENCOUNTER — Ambulatory Visit (INDEPENDENT_AMBULATORY_CARE_PROVIDER_SITE_OTHER): Payer: Medicare Other | Admitting: *Deleted

## 2022-03-30 DIAGNOSIS — J309 Allergic rhinitis, unspecified: Secondary | ICD-10-CM | POA: Diagnosis not present

## 2022-04-06 ENCOUNTER — Ambulatory Visit (INDEPENDENT_AMBULATORY_CARE_PROVIDER_SITE_OTHER): Payer: Medicare Other | Admitting: *Deleted

## 2022-04-06 DIAGNOSIS — J309 Allergic rhinitis, unspecified: Secondary | ICD-10-CM

## 2022-04-20 ENCOUNTER — Ambulatory Visit (INDEPENDENT_AMBULATORY_CARE_PROVIDER_SITE_OTHER): Payer: Medicare Other

## 2022-04-20 DIAGNOSIS — J309 Allergic rhinitis, unspecified: Secondary | ICD-10-CM | POA: Diagnosis not present

## 2022-05-11 ENCOUNTER — Ambulatory Visit (INDEPENDENT_AMBULATORY_CARE_PROVIDER_SITE_OTHER): Payer: Medicare Other | Admitting: *Deleted

## 2022-05-11 DIAGNOSIS — J309 Allergic rhinitis, unspecified: Secondary | ICD-10-CM

## 2022-05-18 ENCOUNTER — Ambulatory Visit (INDEPENDENT_AMBULATORY_CARE_PROVIDER_SITE_OTHER): Payer: Medicare Other

## 2022-05-18 DIAGNOSIS — J309 Allergic rhinitis, unspecified: Secondary | ICD-10-CM

## 2022-05-25 ENCOUNTER — Ambulatory Visit (INDEPENDENT_AMBULATORY_CARE_PROVIDER_SITE_OTHER): Payer: Medicare Other | Admitting: *Deleted

## 2022-05-25 DIAGNOSIS — J309 Allergic rhinitis, unspecified: Secondary | ICD-10-CM | POA: Diagnosis not present

## 2022-06-01 ENCOUNTER — Ambulatory Visit (INDEPENDENT_AMBULATORY_CARE_PROVIDER_SITE_OTHER): Payer: Medicare Other | Admitting: *Deleted

## 2022-06-01 DIAGNOSIS — J309 Allergic rhinitis, unspecified: Secondary | ICD-10-CM

## 2022-06-03 ENCOUNTER — Other Ambulatory Visit: Payer: Self-pay | Admitting: Allergy and Immunology

## 2022-06-15 ENCOUNTER — Ambulatory Visit (INDEPENDENT_AMBULATORY_CARE_PROVIDER_SITE_OTHER): Payer: Medicare Other | Admitting: *Deleted

## 2022-06-15 DIAGNOSIS — J309 Allergic rhinitis, unspecified: Secondary | ICD-10-CM

## 2022-07-06 ENCOUNTER — Ambulatory Visit (INDEPENDENT_AMBULATORY_CARE_PROVIDER_SITE_OTHER): Payer: Medicare Other | Admitting: *Deleted

## 2022-07-06 DIAGNOSIS — J309 Allergic rhinitis, unspecified: Secondary | ICD-10-CM

## 2022-07-07 DIAGNOSIS — J302 Other seasonal allergic rhinitis: Secondary | ICD-10-CM

## 2022-07-07 NOTE — Progress Notes (Signed)
EXP 07/08/23

## 2022-08-03 ENCOUNTER — Ambulatory Visit (INDEPENDENT_AMBULATORY_CARE_PROVIDER_SITE_OTHER): Payer: Medicare Other | Admitting: *Deleted

## 2022-08-03 DIAGNOSIS — J309 Allergic rhinitis, unspecified: Secondary | ICD-10-CM

## 2022-08-24 ENCOUNTER — Ambulatory Visit (INDEPENDENT_AMBULATORY_CARE_PROVIDER_SITE_OTHER): Payer: Medicare Other | Admitting: *Deleted

## 2022-08-24 DIAGNOSIS — J309 Allergic rhinitis, unspecified: Secondary | ICD-10-CM | POA: Diagnosis not present

## 2022-08-31 ENCOUNTER — Ambulatory Visit (INDEPENDENT_AMBULATORY_CARE_PROVIDER_SITE_OTHER): Payer: Medicare Other | Admitting: *Deleted

## 2022-08-31 DIAGNOSIS — J309 Allergic rhinitis, unspecified: Secondary | ICD-10-CM | POA: Diagnosis not present

## 2022-09-07 ENCOUNTER — Ambulatory Visit (INDEPENDENT_AMBULATORY_CARE_PROVIDER_SITE_OTHER): Payer: Medicare Other | Admitting: *Deleted

## 2022-09-07 DIAGNOSIS — J309 Allergic rhinitis, unspecified: Secondary | ICD-10-CM

## 2022-09-14 ENCOUNTER — Ambulatory Visit (INDEPENDENT_AMBULATORY_CARE_PROVIDER_SITE_OTHER): Payer: Medicare Other | Admitting: *Deleted

## 2022-09-14 DIAGNOSIS — J309 Allergic rhinitis, unspecified: Secondary | ICD-10-CM | POA: Diagnosis not present

## 2022-11-30 ENCOUNTER — Other Ambulatory Visit: Payer: Self-pay | Admitting: Allergy and Immunology
